# Patient Record
Sex: Female | Born: 1965 | Race: White | Hispanic: No | Marital: Married | Smoking: Former smoker
Health system: Southern US, Community
[De-identification: ages and names within clinical notes are randomized; demographics above are authoritative.]

## PROBLEM LIST (undated history)

## (undated) DIAGNOSIS — I10 Essential (primary) hypertension: Secondary | ICD-10-CM

## (undated) DIAGNOSIS — L80 Vitiligo: Secondary | ICD-10-CM

## (undated) HISTORY — DX: Essential (primary) hypertension: I10

## (undated) HISTORY — PX: BREAST SURGERY: SHX581

## (undated) HISTORY — DX: Vitiligo: L80

---

## 2001-06-03 HISTORY — PX: GASTRIC BYPASS: SHX52

## 2003-07-21 ENCOUNTER — Other Ambulatory Visit: Admission: RE | Admit: 2003-07-21 | Discharge: 2003-07-21 | Payer: Self-pay | Admitting: Obstetrics and Gynecology

## 2004-05-24 ENCOUNTER — Ambulatory Visit: Payer: Self-pay | Admitting: Family Medicine

## 2004-05-29 ENCOUNTER — Ambulatory Visit: Payer: Self-pay | Admitting: Family Medicine

## 2004-07-09 ENCOUNTER — Ambulatory Visit: Payer: Self-pay | Admitting: Family Medicine

## 2004-08-16 ENCOUNTER — Other Ambulatory Visit: Admission: RE | Admit: 2004-08-16 | Discharge: 2004-08-16 | Payer: Self-pay | Admitting: Obstetrics and Gynecology

## 2004-10-03 ENCOUNTER — Ambulatory Visit: Payer: Self-pay | Admitting: Family Medicine

## 2005-04-29 ENCOUNTER — Ambulatory Visit: Payer: Self-pay | Admitting: Family Medicine

## 2005-09-18 ENCOUNTER — Other Ambulatory Visit: Admission: RE | Admit: 2005-09-18 | Discharge: 2005-09-18 | Payer: Self-pay | Admitting: Obstetrics and Gynecology

## 2005-11-20 ENCOUNTER — Ambulatory Visit: Payer: Self-pay | Admitting: Family Medicine

## 2005-11-27 ENCOUNTER — Ambulatory Visit (HOSPITAL_COMMUNITY): Admission: RE | Admit: 2005-11-27 | Discharge: 2005-11-27 | Payer: Self-pay | Admitting: *Deleted

## 2007-07-16 IMAGING — CT CT PELVIS W/ CM
1 of 3 series · 14 of 32 positions shown, 19 images · IV contrast (omnipaque)
Comparison: None available.

CLINICAL DATA: 40-year-old with gastric bypass surgery done 0001.  Patient is having abdominal pain radiating to back for 12 days. 
ABDOMEN CT WITH CONTRAST:
TECHNIQUE: Multidetector CT imaging of the abdomen was performed following the standard protocol during bolus administration of intravenous contrast.
Contrast:  125 cc Omnipaque 300.
TECHNIQUE: Multidetector CT imaging of the pelvis was performed following the standard protocol during bolus administration of intravenous contrast.

[Series 2: abd_pel 5.0 b40f st · axial · 0.59mm/px · z∈[-572,-172]mm · 14 of 90 slices shown, 19 images]
[im 5/90  soft-tissue]
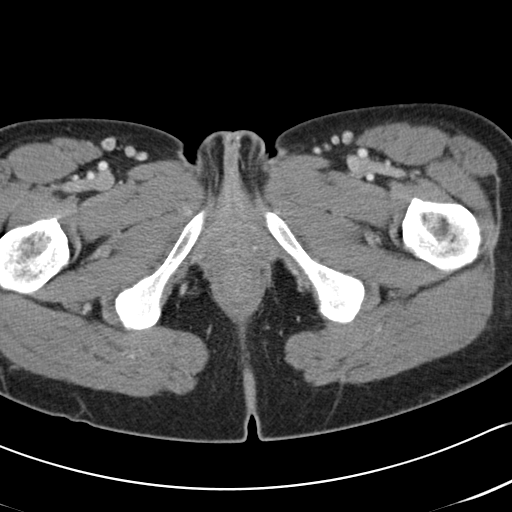
[im 5/90  bone]
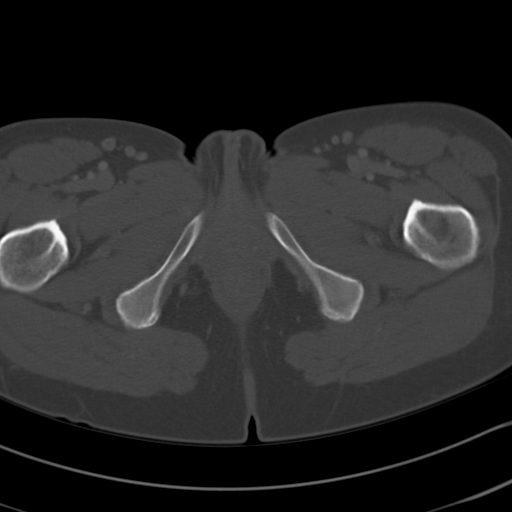
[im 15/90  soft-tissue]
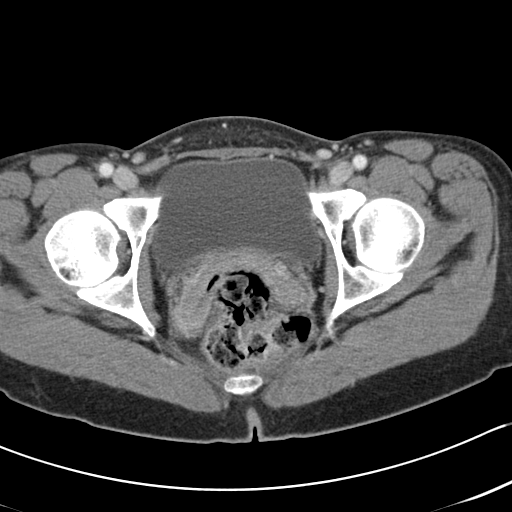
[im 19/90  soft-tissue]
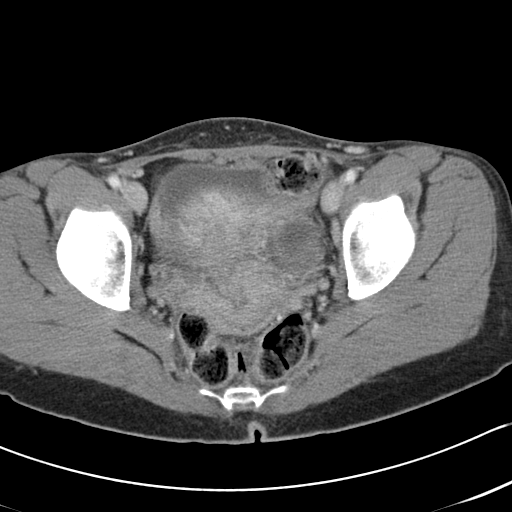
[im 24/90  soft-tissue]
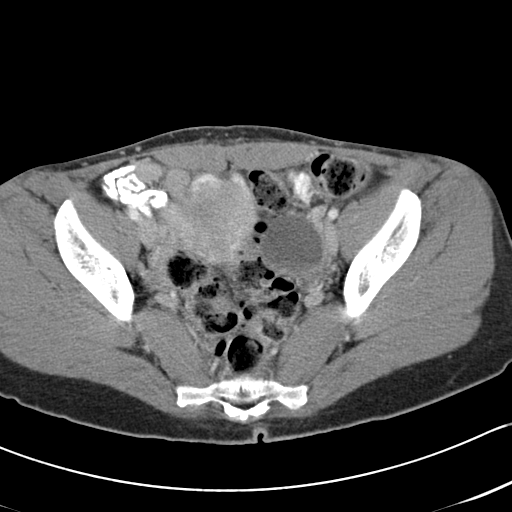
[im 33/90  soft-tissue]
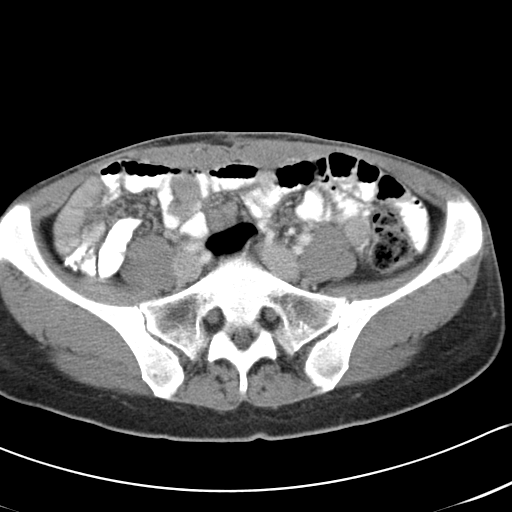
[im 38/90  soft-tissue]
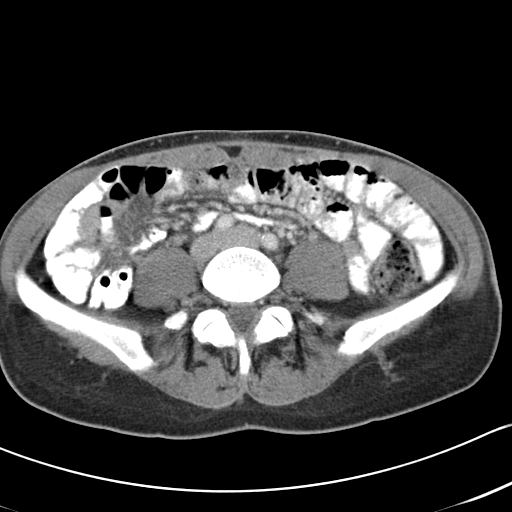
[im 47/90  soft-tissue]
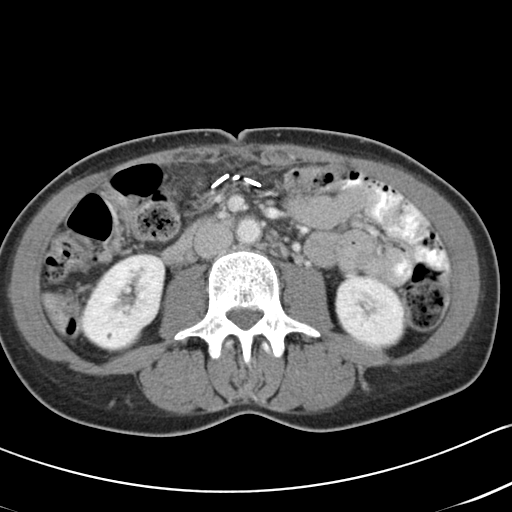
[im 52/90  soft-tissue]
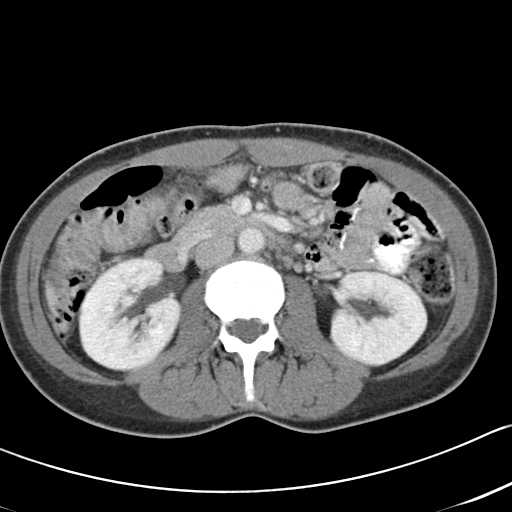
[im 57/90  soft-tissue]
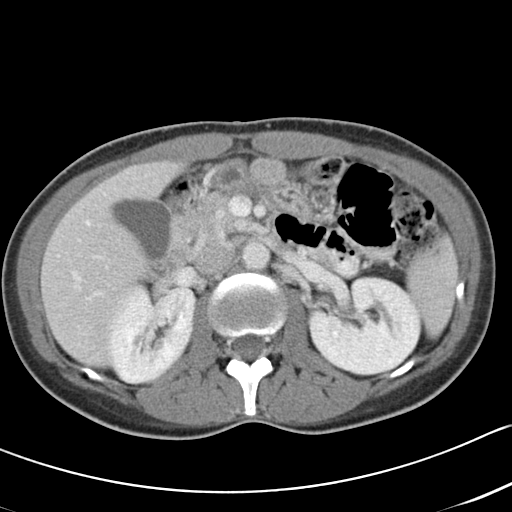
[im 57/90  bone]
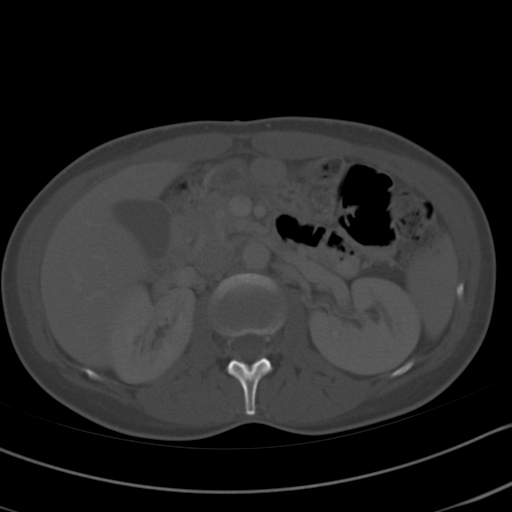
[im 66/90  soft-tissue]
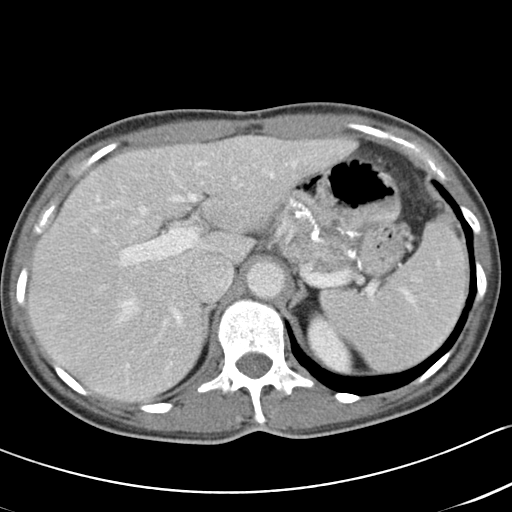
[im 71/90  soft-tissue]
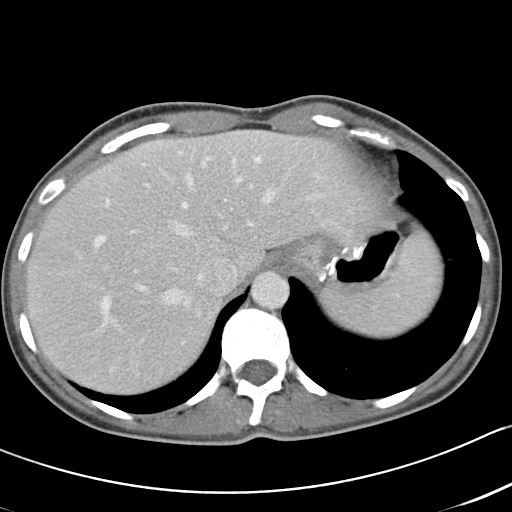
[im 71/90  lung]
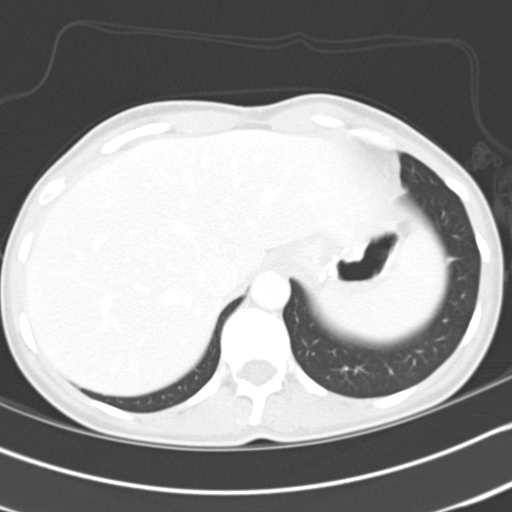
[im 75/90  soft-tissue]
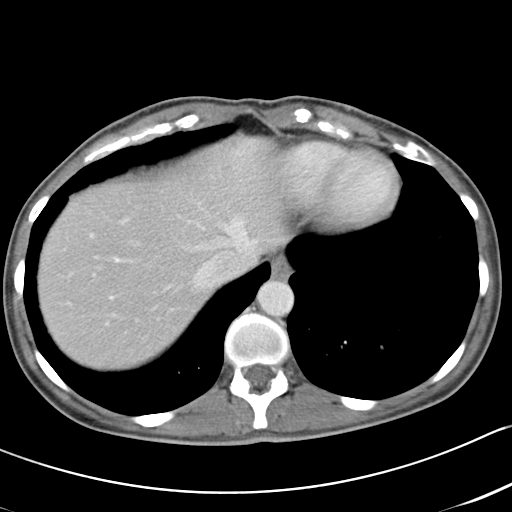
[im 75/90  lung]
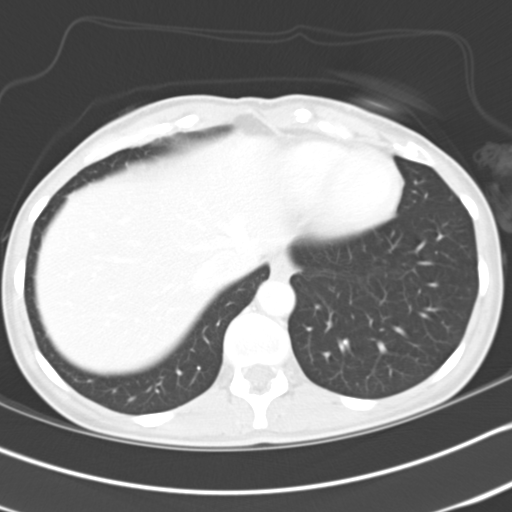
[im 80/90  lung]
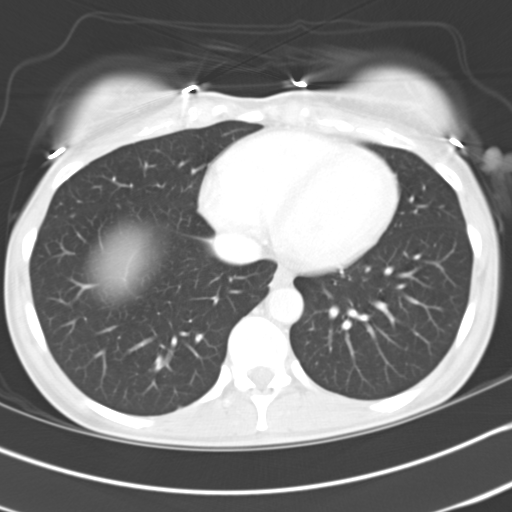
[im 85/90  soft-tissue]
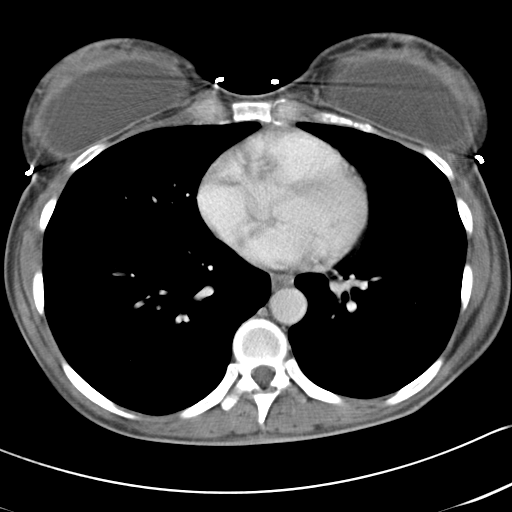
[im 85/90  lung]
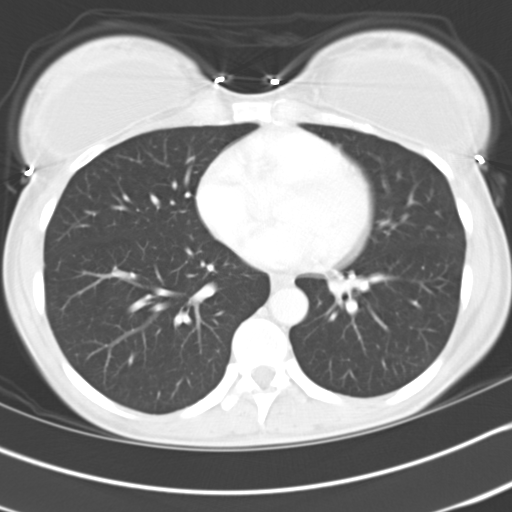

[14 of 32 positions shown; findings below may reference images not displayed]

FINDINGS: The lung bases are clear.  
The liver, spleen, pancreas, adrenal glands and kidneys are unremarkable.  There are few tiny renal cysts bilaterally.  No hydronephrosis.  The gallbladder appears normal.  There are surgical changes related to gastric bypass surgery.  I do not see any complicating features.  No findings to suggest an internal hernia or obstruction.  There is contrast all the way through to the colon without bowel dilatation or wall thickening.  The SMA and SMV have a normal relationship.  The aorta is normal in caliber.  The major branch vessels are normal.  The bony structures are intact.  No mesenteric or retroperitoneal masses or adenopathy.
IMPRESSION: 1.  Surgical change related to gastric bypass surgery.  I do not see any complicating features such as internal hernia or obstruction.
2.  Remainder of the abdomen is unremarkable. 
PELVIS CT WITH CONTRAST:
FINDINGS: There is a 3.8 X 3.8 cm cyst associated with the left ovary.  There is some layering higher attenuation debris, which likely reflects hemorrhage.  Endometrium is slightly prominent but it is probably due to secretary phase of ovulation.  The rectum, sigmoid colon and visualized small bowel loops are unremarkable.
IMPRESSION: Unremarkable CT pelvis except for a 3.8 cm slightly complex cyst associated with the left ovary.  I would recommend  sonographic follow-up to make sure this resolves.

## 2008-04-18 ENCOUNTER — Encounter: Payer: Self-pay | Admitting: Family Medicine

## 2009-04-03 LAB — HM PAP SMEAR

## 2009-04-19 ENCOUNTER — Ambulatory Visit: Payer: Self-pay | Admitting: Family Medicine

## 2009-04-26 ENCOUNTER — Telehealth: Payer: Self-pay | Admitting: Family Medicine

## 2009-05-02 ENCOUNTER — Encounter: Admission: RE | Admit: 2009-05-02 | Discharge: 2009-05-02 | Payer: Self-pay | Admitting: Obstetrics and Gynecology

## 2009-09-06 ENCOUNTER — Ambulatory Visit: Payer: Self-pay | Admitting: Family Medicine

## 2009-09-06 DIAGNOSIS — IMO0002 Reserved for concepts with insufficient information to code with codable children: Secondary | ICD-10-CM

## 2009-09-08 ENCOUNTER — Ambulatory Visit: Payer: Self-pay | Admitting: Family Medicine

## 2009-09-11 ENCOUNTER — Ambulatory Visit: Payer: Self-pay | Admitting: Family Medicine

## 2009-09-12 ENCOUNTER — Encounter: Payer: Self-pay | Admitting: Family Medicine

## 2009-09-14 ENCOUNTER — Ambulatory Visit: Payer: Self-pay | Admitting: Family Medicine

## 2009-12-19 ENCOUNTER — Telehealth: Payer: Self-pay | Admitting: Family Medicine

## 2009-12-21 ENCOUNTER — Telehealth: Payer: Self-pay | Admitting: Family Medicine

## 2010-02-09 ENCOUNTER — Ambulatory Visit: Payer: Self-pay | Admitting: Family Medicine

## 2010-02-09 DIAGNOSIS — F070 Personality change due to known physiological condition: Secondary | ICD-10-CM

## 2010-04-06 ENCOUNTER — Telehealth: Payer: Self-pay | Admitting: Family Medicine

## 2010-04-30 ENCOUNTER — Ambulatory Visit: Payer: Self-pay | Admitting: Family Medicine

## 2010-04-30 DIAGNOSIS — J069 Acute upper respiratory infection, unspecified: Secondary | ICD-10-CM | POA: Insufficient documentation

## 2010-05-03 ENCOUNTER — Telehealth: Payer: Self-pay | Admitting: Family Medicine

## 2010-07-03 NOTE — Assessment & Plan Note (Signed)
Summary: BOIL/DLO   Vital Signs:  Patient profile:   45 year old female Height:      62.25 inches Weight:      140.38 pounds BMI:     25.56 Temp:     98 degrees F Pulse rate:   80 / minute Resp:     16 per minute   History of Present Illness: 45 year old female:  R arm boil  Approx 2-3 days ago, post arm redness and pain developed with induration. Has been spreading. No trauma. No known bite. Wonders if it might be a "spider bite."  ROS: no fever, chill, sweats.  GEN: Well-developed,well-nourished,in no acute distress; alert,appropriate and cooperative throughout examination HEENT: Normocephalic and atraumatic without obvious abnormalities. No apparent alopecia or balding. Ears, externally no deformities PULM: Breathing comfortably in no respiratory distress EXT: No clubbing, cyanosis, or edema PSYCH: Normally interactive. Cooperative during the interview. Pleasant. Friendly and conversant. Not anxious or depressed appearing. Normal, full affect.   R arm: 3 cm across, raised slightly warm, indurated area with no fluctuance.  Allergies: 1)  ! Sulfa   Impression & Recommendations:  Problem # 1:  ABSCESS, ARM (ICD-682.3) Assessment New i think this is an abcess, R post arm  warm compresses 10 times a day  f/u friday to recheck and see if needs I and D --- with Dr. Dayton Martes  Her updated medication list for this problem includes:    Doxycycline Hyclate 100 Mg Caps (Doxycycline hyclate) .Marland Kitchen... Take 1 tab twice a day  Complete Medication List: 1)  Xanax 0.5 Mg Tabs (Alprazolam) .... Take 1 tab by mouth as needed for pms (uses only occasionally) 2)  Ambien 10 Mg Tabs (Zolpidem tartrate) .... Take 1 tab by mouth at bedtime as needed (very occasional) 3)  Multivitamins Tabs (Multiple vitamin) .... Take 1 tablet by mouth once a day 4)  Doxycycline Hyclate 100 Mg Caps (Doxycycline hyclate) .... Take 1 tab twice a day  Patient Instructions: 1)  f/u Friday with Dr.  Dayton Martes Prescriptions: DOXYCYCLINE HYCLATE 100 MG CAPS (DOXYCYCLINE HYCLATE) Take 1 tab twice a day  #20 x 0   Entered and Authorized by:   Hannah Beat MD   Signed by:   Hannah Beat MD on 09/06/2009   Method used:   [facility]        [id number]       [address]       [place], [place]  [address]       Ph: [phone]       Fax: [phone]   RxID:   [id number]

## 2010-07-03 NOTE — Progress Notes (Signed)
Summary: pt has ? thrush  Phone Note Call from Patient Call back at [facility] Phone [phone] Call back at [phone]   Caller: Patient Summary of Call: Pt states she has thrush in her mouth and she is asking if she can have nystatin called in.  She was seen by her gyn last week and treated for a vaginal yeast infection. She says she has had thrush before so she knows this is the same thing.   [facility] Initial call taken by: Lowella Petties CMA,  December 19, 2009 11:41 AM    New/Updated Medications: NYSTATIN 100000 UNIT/ML SUSP (NYSTATIN) 5 ml swish, hold and swollow qid Prescriptions: NYSTATIN 100000 UNIT/ML SUSP (NYSTATIN) 5 ml swish, hold and swollow qid  #50 ml x 0   Entered and Authorized by:   Ruthe Mannan MD   Signed by:   Ruthe Mannan MD on 12/19/2009   Method used:   [facility] to        [id number]       [address]       [place], [place]  [address]       Ph: [phone]       Fax: [phone]   RxID:   [id number]   Appended Document: pt has ? thrush Spoke with patient and notified of prescription.

## 2010-07-03 NOTE — Progress Notes (Signed)
Summary: ok to take excedrin?  Phone Note Call from Patient Call back at [phone]   Caller: Patient Call For: Ruthe Mannan MD Summary of Call: Pt has been having some severe headaches and she is asking if ok for her to take excedrin migraine.  She has a sulfa allergy and just wants to make sure that would be ok.  Leave message if she doesnt answer. Initial call taken by: Lowella Petties CMA, AAMA,  April 06, 2010 2:29 PM  Follow-up for Phone Call        It should be safe.   Ruthe Mannan MD  April 06, 2010 3:00 PM Advised pt.   Lowella Petties CMA, AAMA  April 06, 2010 3:16 PM

## 2010-07-03 NOTE — Assessment & Plan Note (Signed)
Summary: COUGH,ST,CONGESTION/CLE   Vital Signs:  Patient profile:   45 year old female Height:      62.25 inches Weight:      141 pounds BMI:     25.67 Temp:     98.6 degrees F oral Pulse rate:   84 / minute Pulse rhythm:   regular BP sitting:   130 / 80  (right arm) Cuff size:   regular  Vitals Entered By: Linde Gillis CMA Duncan Dull) (April 30, 2010 9:58 AM) CC: sore throat, stuffy nose, congestion   History of Present Illness: 45 yo here to discuss two issues.  1.  URI- last few days, very congested.  Had body aches yesterday, subjective fever.  Dry cough, cough suppressant works.  Mild sore throat.  No wheezing or SOB.  No CP but feels "tight" today.  2.  Attention issues- was on Adderall but does not want to be on it long term.  Asks if there is anything else she can try.  Has had issues with depression since husband left.  no anxiety, SI or HI.  They are in counselling together, trying to work things out.  Current Medications (verified): 1)  Adderall 5 Mg  Tabs (Amphetamine-Dextroamphetamine) .Marland Kitchen.. 1 Tab By Mouth Every Morning. 2)  Wellbutrin Sr 150 Mg Xr12h-Tab (Bupropion Hcl) .Marland Kitchen.. 1 Tab By Mouth Every  Morning.  Allergies: 1)  ! Sulfa  Past History:  Past Medical History: Last updated: 04/19/2009 previous h/o HTN, HLD, asthma prior to gastric bypass in 2003  Past Surgical History: Last updated: 04/19/2009 Gastric bypass 2003  Family History: Last updated: 04/19/2009 Adopted, does no know family history  Social History: Last updated: 04/19/2009 Lives with husband and three children, ages 16, 79, and 86 in [place].   Never Smoked Alcohol use-no Drug use-no Regular exercise-yes  Risk Factors: Exercise: yes (04/19/2009)  Risk Factors: Smoking Status: never (04/19/2009)  Review of Systems      See HPI General:  Complains of chills and fever. ENT:  Complains of postnasal drainage, sinus pressure, and sore throat. CV:  Denies chest pain or  discomfort. Resp:  Complains of cough; denies shortness of breath, sputum productive, and wheezing. GI:  Denies abdominal pain, nausea, and vomiting.  Physical Exam  General:  Well-developed,well-nourished,in no acute distress; alert,appropriate and cooperative throughout examination Ears:  R ear normal and L ear normal.   Nose:  mild erythema Mouth:  MMM, pharyngeal erythema.   Lungs:  normal respiratory effort.   Heart:  Normal rate and regular rhythm. S1 and S2 normal without gallop, murmur, click, rub or other extra sounds. Extremities:  No clubbing, cyanosis, edema, or deformity noted with normal full range of motion of all joints.   Psych:  normally interactive, good eye contact, not anxious appearing, and not depressed appearing.     Impression & Recommendations:  Problem # 1:  POOR CONCENTRATION (ICD-310.1) Assessment Unchanged Will try Wellbutrin 150 mg daily. Follow up in one month.  Problem # 2:  URI (ICD-465.9) Assessment: New Likely viral, continue supportive care as per pt instructions.  Complete Medication List: 1)  Adderall 5 Mg Tabs (Amphetamine-dextroamphetamine) .Marland Kitchen.. 1 tab by mouth every morning. 2)  Wellbutrin Sr 150 Mg Xr12h-tab (Bupropion hcl) .Marland Kitchen.. 1 tab by mouth every  morning.  Patient Instructions: 1)  Get plenty of rest, drink lots of clear liquids, and use Tylenol or Ibuprofen for fever and comfort. Return in 7-10 days if you're not better: sooner if you'er feeling worse.  2)  Follow up in one month. Prescriptions: WELLBUTRIN SR 150 MG XR12H-TAB (BUPROPION HCL) 1 tab by mouth every  morning.  #30 x 0   Entered and Authorized by:   Ruthe Mannan MD   Signed by:   Ruthe Mannan MD on 04/30/2010   Method used:   [facility] to        [id number]       [address]       [place], [place]  [address]       Ph: [phone]       Fax: [phone]   RxID:   [id number]    Orders Added: 1)  Est. Patient Level IV  [06301]    Current Allergies (reviewed today): ! SULFA

## 2010-07-03 NOTE — Miscellaneous (Signed)
Summary: Consent for I & D Right Upper Arm  Consent for I & D Right Upper Arm   Imported By: Beau Fanny 09/11/2009 14:48:15  _____________________________________________________________________  External Attachment:    Type:   Image     Comment:   External Document

## 2010-07-03 NOTE — Assessment & Plan Note (Signed)
Summary: FOLLOW UP / LFW   Vital Signs:  Patient profile:   45 year old female Height:      62.25 inches Weight:      136.4 pounds BMI:     24.84 Temp:     98.3 degrees F oral Pulse rate:   80 / minute Pulse rhythm:   regular BP sitting:   120 / 80  (left arm) Cuff size:   regular  Vitals Entered By: Benny Lennert CMA Duncan Dull) (September 14, 2009 2:09 PM)  History of Present Illness: Chief complaint follow up boil  Allergies: 1)  ! Sulfa   Impression & Recommendations:  Problem # 1:  ABSCESS, ARM (ICD-682.3)  f/u s/p I and D  wound looks good clean, good granulation tissue much less indurated  universal procedure window  The following medications were removed from the medication list:    Keflex 250 Mg Caps (Cephalexin) .Marland Kitchen... 1 tab every 6 hours x 10 days Her updated medication list for this problem includes:    Doxycycline Hyclate 100 Mg Caps (Doxycycline hyclate) .Marland Kitchen... Take 1 tab twice a day  Orders: No Charge Patient Arrived (NCPA0) (NCPA0)  Complete Medication List: 1)  Xanax 0.5 Mg Tabs (Alprazolam) .... Take 1 tab by mouth as needed for pms (uses only occasionally) 2)  Ambien 10 Mg Tabs (Zolpidem tartrate) .... Take 1 tab by mouth at bedtime as needed (very occasional) 3)  Multivitamins Tabs (Multiple vitamin) .... Take 1 tablet by mouth once a day 4)  Doxycycline Hyclate 100 Mg Caps (Doxycycline hyclate) .... Take 1 tab twice a day  Current Allergies (reviewed today): ! SULFA

## 2010-07-03 NOTE — Assessment & Plan Note (Signed)
Summary: TALK TO U ABOUT SOME THINGS/DLO   Vital Signs:  Patient profile:   45 year old female Height:      62.25 inches Weight:      148 pounds BMI:     26.95 Temp:     98.7 degrees F oral Pulse rate:   80 / minute Pulse rhythm:   regular BP sitting:   102 / 70  (right arm) Cuff size:   regular  Vitals Entered By: Linde Gillis CMA Duncan Dull) (February 09, 2010 12:17 PM) CC: discuss personal issues   History of Present Illness: 45 yo here to discuss difficulty concentrating.  Started several months ago but states that she has always had some issues with this throughout her life.  No formal diagnosis of ADHD. Has taken Wellbutrin in past, did not like how it made her feel. Last several weeks, cannot concentrate at work, forgets what she was supposed to be doing for her boss.  Was under a lot of stress over the past several months but those stressors are improving. Denies any symptoms of anxiety, depression. She is sleeping ok.  Appetite is good.  Current Medications (verified): 1)  Adderall 5 Mg  Tabs (Amphetamine-Dextroamphetamine) .Marland Kitchen.. 1 Tab By Mouth Every Morning.  Allergies: 1)  ! Sulfa  Past History:  Past Medical History: Last updated: 04/19/2009 previous h/o HTN, HLD, asthma prior to gastric bypass in 2003  Past Surgical History: Last updated: 04/19/2009 Gastric bypass 2003  Family History: Last updated: 04/19/2009 Adopted, does no know family history  Social History: Last updated: 04/19/2009 Lives with husband and three children, ages 51, 35, and 66 in [place].   Never Smoked Alcohol use-no Drug use-no Regular exercise-yes  Risk Factors: Exercise: yes (04/19/2009)  Risk Factors: Smoking Status: never (04/19/2009)  Review of Systems      See HPI Psych:  Denies alternate hallucination ( auditory/visual), anxiety, depression, easily angered, easily tearful, irritability, mental problems, panic attacks, sense of great danger, suicidal  thoughts/plans, thoughts of violence, unusual visions or sounds, and thoughts /plans of harming others.  Physical Exam  General:  Well-developed,well-nourished,in no acute distress; alert,appropriate and cooperative throughout examination Psych:  normally interactive, good eye contact, not anxious appearing, and not depressed appearing.     Impression & Recommendations:  Problem # 1:  POOR CONCENTRATION (ICD-310.1) Assessment New Has not had a formal ADD assessment but per her description, she has had these symptoms since childhood. Will try low dose Adderall. Pt to follow up with me in one month. Time spent with patient 25 minutes, more than 50% of this time was spent counseling patient on ADHD.  Complete Medication List: 1)  Adderall 5 Mg Tabs (Amphetamine-dextroamphetamine) .Marland Kitchen.. 1 tab by mouth every morning. Prescriptions: ADDERALL 5 MG  TABS (AMPHETAMINE-DEXTROAMPHETAMINE) 1 tab by mouth every morning.  #30 x 0   Entered and Authorized by:   Ruthe Mannan MD   Signed by:   Ruthe Mannan MD on 02/09/2010   Method used:   Print then Give to Patient   RxID:   [id number]   Prior Medications (reviewed today): None Current Allergies (reviewed today): ! SULFA

## 2010-07-03 NOTE — Assessment & Plan Note (Signed)
Summary: LANCE BOIL PER ARON/DLO   Vital Signs:  Patient profile:   45 year old female Height:      62.25 inches Weight:      138.2 pounds BMI:     25.17 Temp:     98.6 degrees F oral Pulse rate:   84 / minute Pulse rhythm:   regular BP sitting:   108 / 60  (left arm) Cuff size:   regular  Vitals Entered By: Benny Lennert CMA Duncan Dull) (September 11, 2009 10:04 AM)  History of Present Illness: Chief complaint lance boil  R posterior arm  cellulitis abcess: she has had some improvement per her report, but on my examination, the area of induration is larger. Continues with redness and pain on Doxy and Keflex.  Has had a small amount of drainage after doing some moist compresses.  No fevers.  I and D 3 cc purulent, loculated   ROS: recently in hospital with husbands hosp, no fever, chills, sweats, sob.    Allergies: 1)  ! Sulfa  Past History:  Past medical, surgical, family and social histories (including risk factors) reviewed, and no changes noted (except as noted below).  Past Medical History: Reviewed history from 04/19/2009 and no changes required. previous h/o HTN, HLD, asthma prior to gastric bypass in 2003  Past Surgical History: Reviewed history from 04/19/2009 and no changes required. Gastric bypass 2003  Family History: Reviewed history from 04/19/2009 and no changes required. Adopted, does no know family history  Social History: Reviewed history from 04/19/2009 and no changes required. Lives with husband and three children, ages 28, 21, and 85 in [place].   Never Smoked Alcohol use-no Drug use-no Regular exercise-yes  Physical Exam  General:  Well-developed,well-nourished,in no acute distress; alert,appropriate and cooperative throughout examination Head:  Normocephalic and atraumatic without obvious abnormalities. No apparent alopecia or balding. Ears:  no external deformities.   Nose:  no external deformity.   Lungs:  normal respiratory  effort.   Skin:  4 cm across, raised slightly warm, indurated area extends beyond this. There is one area of central fluctuance.    Impression & Recommendations:  Problem # 1:  ABSCESS, ARM (ICD-682.3) Assessment Deteriorated Cont with abx  I and D send for culture and sensitivity  Her updated medication list for this problem includes:    Doxycycline Hyclate 100 Mg Caps (Doxycycline hyclate) .Marland Kitchen... Take 1 tab twice a day    Keflex 250 Mg Caps (Cephalexin) .Marland Kitchen... 1 tab every 6 hours x 10 days  Orders: T-Culture, Wound ([id number]) I&D Abscess, Complex (10061)  Complete Medication List: 1)  Xanax 0.5 Mg Tabs (Alprazolam) .... Take 1 tab by mouth as needed for pms (uses only occasionally) 2)  Ambien 10 Mg Tabs (Zolpidem tartrate) .... Take 1 tab by mouth at bedtime as needed (very occasional) 3)  Multivitamins Tabs (Multiple vitamin) .... Take 1 tablet by mouth once a day 4)  Doxycycline Hyclate 100 Mg Caps (Doxycycline hyclate) .... Take 1 tab twice a day 5)  Keflex 250 Mg Caps (Cephalexin) .Marland Kitchen.. 1 tab every 6 hours x 10 days  Patient Instructions: 1)  f/u Thursday with me  Current Allergies (reviewed today): ! SULFA   PROCEDURE:   I & D Site: R posterior arm Size: 4 cm area Anesthesia: 8 cc of 1/2 and 1/2, 4 mL Lidocaine 2% and 4 mL Marcaine 0.5% Procedure: Patient was prepped with alcohol then anesthetized without difficulty. 18 gauge needle was used to aspirate and minimal pus came  out. Then #11 blade used to open area, and signifant amounts of congealed, loculated material expressed. Approx 3 mL of total materal expressed after several rounds of breaking up loculations. Sterile wick placed and wound dressed. Cultures sent. Disposition: Return to clinic as Instructed by MD  Appended Document: LANCE BOIL PER ARON/DLO

## 2010-07-03 NOTE — Progress Notes (Signed)
Summary: feeling worse   Phone Note Call from Patient Call back at [phone]   Caller: Patient Call For: Dr. Milinda Antis  Summary of Call: Patient saw Dr. Dayton Martes on Monday with URI symptoms. Patient is calling back today stating that she feel worse than she did, feels like her throat is on fire, coughing up green phlegm. She is asking if she could have antibiotic called in to [facility] [address]  Initial call taken by: Melody Comas,  May 03, 2010 1:26 PM  Follow-up for Phone Call        will tx with zpak and f/u if not improved px written on EMR for call in  Follow-up by: Judith Part MD,  May 03, 2010 1:56 PM  Additional Follow-up for Phone Call Additional follow up Details #1::        [facility] as instructed. Left message for patient to call back. Lewanda Rife LPN  May 03, 2010 2:39 PM   Advised pt. Additional Follow-up by: Lowella Petties CMA, AAMA,  May 03, 2010 2:59 PM    New/Updated Medications: ZITHROMAX Z-PAK 250 MG TABS (AZITHROMYCIN) take by mouth as directed Prescriptions: ZITHROMAX Z-PAK 250 MG TABS (AZITHROMYCIN) take by mouth as directed  #1 pack x 0   Entered and Authorized by:   Judith Part MD   Signed by:   Lewanda Rife LPN on 45/40/9811   Method used:   [facility] ...       [facility]  [address] [id number]       [address]       [place], [place]  [address]       Ph: [phone]       Fax: [phone]   RxID:   [id number]

## 2010-07-03 NOTE — Progress Notes (Signed)
Summary: requests refill on nystatin  Phone Note Call from Patient Call back at Home Phone [phone] Call back at [phone]   Caller: Patient Summary of Call: Pt was given nystatin for thrush, she says this is working well but she doesnt have enough to last. She will run out of this today and needs to continue for a few more days. Requesting refill.  [facility] Initial call taken by: Lowella Petties CMA,  December 21, 2009 12:13 PM    Prescriptions: NYSTATIN 100000 UNIT/ML SUSP (NYSTATIN) 5 ml swish, hold and swollow qid  #50 ml x 0   Entered and Authorized by:   Ruthe Mannan MD   Signed by:   Ruthe Mannan MD on 12/21/2009   Method used:   [facility]ically to        [id number]       [address]       [place], [place]  [address]       Ph: [phone]       Fax: [phone]   RxID:   [id number]

## 2010-07-03 NOTE — Assessment & Plan Note (Signed)
Summary: F/U PER [facility]   Vital Signs:  Patient profile:   45 year old female Height:      62.25 inches Weight:      138 pounds BMI:     25.13 Temp:     98.5 degrees F oral Pulse rate:   84 / minute Pulse rhythm:   regular BP sitting:   102 / 62  (left arm) Cuff size:   regular  Vitals Entered By: Delilah Shan CMA Duncan Dull) (September 08, 2009 9:47 AM) CC: F/U per Dr. Patsy Lager   History of Present Illness: 45 year old female here for follow up right arm abscess.  Seen two days ago by Dr. Patsy Lager for 3 cm red, painful righ arm abscess.  Non fluctuant at that time, unable to I and D.  Placed on Doxy (has sulfa allergy) and advised to follow up today.  Since last seen, redness is better, but boil is exactly the same.  She is trying warm compresses 5 times per day. No fevers, chills, nausea, vomiting or diarrhea.    Current Medications (verified): 1)  Xanax 0.5 Mg Tabs (Alprazolam) .... Take 1 Tab By Mouth As Needed For Pms (Uses Only Occasionally) 2)  Ambien 10 Mg Tabs (Zolpidem Tartrate) .... Take 1 Tab By Mouth At Bedtime As Needed (Very Occasional) 3)  Multivitamins   Tabs (Multiple Vitamin) .... Take 1 Tablet By Mouth Once A Day 4)  Doxycycline Hyclate 100 Mg Caps (Doxycycline Hyclate) .... Take 1 Tab Twice A Day 5)  Keflex 250 Mg Caps (Cephalexin) .Marland Kitchen.. 1 Tab Every 6 Hours X 10 Days  Allergies: 1)  ! Sulfa  Review of Systems      See HPI General:  Denies chills, fatigue, fever, and malaise. GI:  Denies diarrhea, nausea, and vomiting.  Physical Exam  General:  Well-developed,well-nourished,in no acute distress; alert,appropriate and cooperative throughout examination Mouth:  MMM Skin:  3 cm across, raised slightly warm, indurated area with no fluctuance. Psych:  normally interactive, good eye contact, not anxious appearing, and not depressed appearing.     Impression & Recommendations:  Problem # 1:  ABSCESS, ARM (ICD-682.3) Assessment Unchanged Non fluctuant,  explained that I and D at this point will not be very beneficial. Not worsening but not improving much either. Will give prescription for Keflex as well.  I want her to fill it to take with doxy if she spikes temp over weekend or redness begins to spread. See pt instructions for details. She will follow up with me or Dr. Patsy Lager on Monday. Her updated medication list for this problem includes:    Doxycycline Hyclate 100 Mg Caps (Doxycycline hyclate) .Marland Kitchen... Take 1 tab twice a day    Keflex 250 Mg Caps (Cephalexin) .Marland Kitchen... 1 tab every 6 hours x 10 days  Complete Medication List: 1)  Xanax 0.5 Mg Tabs (Alprazolam) .... Take 1 tab by mouth as needed for pms (uses only occasionally) 2)  Ambien 10 Mg Tabs (Zolpidem tartrate) .... Take 1 tab by mouth at bedtime as needed (very occasional) 3)  Multivitamins Tabs (Multiple vitamin) .... Take 1 tablet by mouth once a day 4)  Doxycycline Hyclate 100 Mg Caps (Doxycycline hyclate) .... Take 1 tab twice a day 5)  Keflex 250 Mg Caps (Cephalexin) .Marland Kitchen.. 1 tab every 6 hours x 10 days  Patient Instructions: 1)  Good to see you. 2)  Come me or Dr. Patsy Lager on Monday to follow up. 3)  Start the Keflex if you feel  redness or size has worsened or feel feverish. 4)  If you feel really sick over weekend, call doctor on call. Prescriptions: KEFLEX 250 MG CAPS (CEPHALEXIN) 1 tab every 6 hours x 10 days  #40 x 0   Entered and Authorized by:   Ruthe Mannan MD   Signed by:   Ruthe Mannan MD on 09/08/2009   Method used:   Print then Give to Patient   RxID:   [id number]   Current Allergies (reviewed today): ! SULFA

## 2010-09-14 ENCOUNTER — Encounter: Payer: Self-pay | Admitting: Family Medicine

## 2010-09-20 ENCOUNTER — Ambulatory Visit (INDEPENDENT_AMBULATORY_CARE_PROVIDER_SITE_OTHER): Payer: BLUE CROSS/BLUE SHIELD | Admitting: Family Medicine

## 2010-09-20 ENCOUNTER — Encounter: Payer: Self-pay | Admitting: Family Medicine

## 2010-09-20 DIAGNOSIS — F988 Other specified behavioral and emotional disorders with onset usually occurring in childhood and adolescence: Secondary | ICD-10-CM | POA: Insufficient documentation

## 2010-09-20 MED ORDER — AMPHETAMINE-DEXTROAMPHET ER 20 MG PO CP24: 20.0000 mg | ORAL_CAPSULE | ORAL | Status: AC

## 2010-09-20 NOTE — Progress Notes (Signed)
  Subjective:    Patient ID: Dominique Michael, female    DOB: 18-May-1966, 45 y.o.   MRN: [id number]  HPI  45 yo WF presents for NOV.  She is previously healthy, premenopausal , G3P3.  She sees Dr Vincente Poli annually for her pap, mammo and labs.  She denies any hx of HTN, heart problems, anxiety, depression, palpitations or insomnia.  She is currently not taking any meds.  She checks her BP at home and runs <140/90.    She is here today c/o a resurgence of her ADD.  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  The past year, she has started online college classes and is working part time.  She is juggling this with her 3 kids and she is having a hard time staying focused, completing tasks and is getting overwhelmed.    BP 156/91  Pulse 80  Ht 5\' 2"  (1.575 m)  Wt 151 lb (68.493 kg)  BMI 27.62 kg/m2  SpO2 100%  LMP 08/22/2010   Review of Systems  Constitutional: Negative for fever, chills, appetite change, fatigue and unexpected weight change.  HENT: Negative for congestion, sore throat and rhinorrhea.   Respiratory: Negative for cough, shortness of breath and wheezing.   Cardiovascular: Negative for chest pain, palpitations and leg swelling.  Gastrointestinal: Negative for nausea, diarrhea, constipation and blood in stool.  Genitourinary: Negative for urgency, frequency and difficulty urinating.  Musculoskeletal: Negative for myalgias and arthralgias.  Neurological: Positive for headaches. Negative for tremors.  Hematological: Does not bruise/bleed easily.  Psychiatric/Behavioral: Positive for decreased concentration. Negative for suicidal ideas, sleep disturbance, dysphoric mood and agitation. The patient is not nervous/anxious and is not hyperactive.        Objective:   Physical Exam  Constitutional: She appears well-developed and well-nourished.  HENT:  Head: Normocephalic and atraumatic.  Eyes: Conjunctivae are  normal. No scleral icterus.  Neck: No thyromegaly present.  Cardiovascular: Normal rate, regular rhythm and normal heart sounds.   No murmur heard. Pulmonary/Chest: Effort normal and breath sounds normal. No respiratory distress. She has no wheezes.  Musculoskeletal: She exhibits no edema.  Lymphadenopathy:    She has no cervical adenopathy.  Neurological:       No tremor  Skin: Skin is warm and dry.       vitilgo on hands, elbows  Psychiatric: She has a normal mood and affect.          Assessment & Plan:

## 2010-09-20 NOTE — Patient Instructions (Signed)
Call me with home BP reading next wk.  Start Adderall XR 20 mg in the early morning.  Call if any problems.  Return for office visit in 3 mos.  Call for new RX to pick up each month.

## 2010-09-20 NOTE — Assessment & Plan Note (Signed)
ADD diagnosed years ago and did well on Adderall.  She is currently overwhelmed by juggling school, her 3 kids and work and screens neg for depression and anxiety and has many inattentive symptoms.  She has white coat HTN and her BP is high today.  She will check her BP at home and call me with results.  Will start her on Adderall XR 20 mg qAM.  RTC for f/u in 3 mos.

## 2010-10-03 ENCOUNTER — Telehealth: Payer: Self-pay | Admitting: Family Medicine

## 2010-10-03 NOTE — Telephone Encounter (Signed)
These look great.  OK to stay on Adderall for ADD.

## 2010-10-03 NOTE — Telephone Encounter (Signed)
Pt called as FYI to give BP readings to Dr. Cathey Endow for review. The BP readings are: Last week---127/83 This week----123/82 Please advice . Plan:  Routed to provider Jarvis Newcomer, LPN Domingo Dimes

## 2010-10-04 NOTE — Telephone Encounter (Signed)
Pt notified once response received from DR. Bowen.  Pt told okay to continue Aderrall for ADD since her B/Ps look good.  Pt voiced understanding. Jarvis Newcomer, LPN Domingo Dimes

## 2010-10-26 ENCOUNTER — Telehealth: Payer: Self-pay | Admitting: Family Medicine

## 2010-10-26 NOTE — Telephone Encounter (Signed)
Pt said her insurance [employer] wants pt to change to plain adderall but suggested she do a 90 day supply and told her they could do that even though it is controlled .  Adderall extend release is too high of copay and pt was told from [employer] that the adderall extend release would double every month $130.Marland Kitchen  Last month and this month it will double to $260.00.  Pt doesn't have that kind of money.  If changed, pt said [employer] would give Adderall plain 25 mg and not 20mg  for 90 day supply and it would be $30.00.a month which would = to $90.00.  Plan:    Please advise and routed to Dr. Arlice Colt, LPN Domingo Dimes

## 2010-10-26 NOTE — Telephone Encounter (Signed)
Pt notified that Dr. Cathey Endow researched pt record and she is regular pt of Dr. Dayton Martes at [facility] and her suggestion is to get Dr. Dayton Martes to refill the adderall and see him for her regular PCP care.  Had to [facility] with clear instructions. Jarvis Newcomer, LPN Domingo Dimes

## 2010-10-26 NOTE — Telephone Encounter (Signed)
I researched patient's record and she is a regular patient of Dr Dayton Martes at [facility].  I am no longer going to see this patient or fill her meds.  My advice is for her to see her regular doctor for care including ADD.

## 2010-11-17 ENCOUNTER — Emergency Department: Payer: Self-pay | Admitting: Emergency Medicine

## 2011-11-06 ENCOUNTER — Telehealth: Payer: Self-pay | Admitting: Family Medicine

## 2011-11-06 NOTE — Telephone Encounter (Signed)
Caller: Cammy/Patient; PCP: Ruthe Mannan (Nestor Ramp); CB#: [phone];  Call regarding Thrush; Tongue has red patches. Pt advises she had thrush with the same sx. Her rx is expired. Would like a refill. Advised she will need an appt. Appt declined at this time. Pt dc'd the call before full triage.

## 2011-11-08 ENCOUNTER — Ambulatory Visit (INDEPENDENT_AMBULATORY_CARE_PROVIDER_SITE_OTHER): Payer: Managed Care, Other (non HMO) | Admitting: Family Medicine

## 2011-11-08 ENCOUNTER — Encounter: Payer: Self-pay | Admitting: Family Medicine

## 2011-11-08 VITALS — BP 110/70 | HR 72 | Temp 98.3°F | Ht 62.0 in | Wt 155.0 lb

## 2011-11-08 DIAGNOSIS — B370 Candidal stomatitis: Secondary | ICD-10-CM | POA: Insufficient documentation

## 2011-11-08 DIAGNOSIS — H6120 Impacted cerumen, unspecified ear: Secondary | ICD-10-CM

## 2011-11-08 MED ORDER — NYSTATIN 100000 UNIT/ML MT SUSP: 500000.0000 [IU] | Freq: Four times a day (QID) | OROMUCOSAL | Status: DC

## 2011-11-08 NOTE — Progress Notes (Signed)
46 yo here for:   Right ear pain- feels stopped up, not hearing as well out of her right ear. Tried OTC debrox, does not think she got any wax out. No drainage. No URI symptoms.  Her tongue has been sore for past couple of days.  Feels like when she had thrush in past.  Has had recent abx use and is under stress.   Patient Active Problem List  Diagnoses  . Attention deficit disorder   Past Medical History  Diagnosis Date  . Hypertension   . Asthma     prior to gastric bypass in 2003  . Vitiligo    Past Surgical History  Procedure Date  . Gastric bypass 2003  . Cesarean section   . Breast surgery     breast lift   History  Substance Use Topics  . Smoking status: Never Smoker   . Smokeless tobacco: Not on file  . Alcohol Use: No   Family History  Problem Relation Age of Onset  . Adopted: Yes   Allergies  Allergen Reactions  . Sulfonamide Derivatives     REACTION: Severe reaction.  Avoids foods with Sulfa content.   No current outpatient prescriptions on file prior to visit.   The PMH, PSH, Social History, Family History, Medications, and allergies have been reviewed in [facility], and have been updated if relevant.  ROS: See HPI  No cough, no runny nose. No sore throat. No wheezing.   Physical exam: BP 110/70  Pulse 72  Temp(Src) 98.3 F (36.8 C) (Oral)  Ht 5\' 2"  (1.575 m)  Wt 155 lb (70.308 kg)  BMI 28.35 kg/m2  General:  Well-developed,well-nourished,in no acute distress; alert,appropriate and cooperative throughout examination Head:  normocephalic and atraumatic.   Eyes:  vision grossly intact, pupils equal, pupils round, and pupils reactive to light.   Ears:   Ceruminosis is noted bilaterally, right >left.   After irrigation, TMs clear bilaterally.  Nose:  no external deformity.   Mouth:   White plaques on tongue and buccal mucosa Neck:  No deformities, masses, or tenderness noted. Breasts:  No mass, nodules, thickening, tenderness, bulging,  retraction, inflamation, nipple discharge or skin changes noted.   Lungs:  Normal respiratory effort, chest expands symmetrically. Lungs are clear to auscultation, no crackles or wheezes. Heart:  Normal rate and regular rhythm. S1 and S2 normal without gallop, murmur, click, rub or other extra sounds. Msk:  No deformity or scoliosis noted of thoracic or lumbar spine.   Extremities:  No clubbing, cyanosis, edema, or deformity noted with normal full range of motion of all joints.   Neurologic:  alert & oriented X3 and gait normal.   Skin:  Intact without suspicious lesions or rashes Psych:  Cognition and judgment appear intact. Alert and cooperative with normal attention span and concentration. No apparent delusions, illusions, hallucinations   Assessment and Plan:   1. Cerumen impaction   Wax is removed by syringing and manual debridement. Instructions for home care to prevent wax buildup are given.   2. Thrush, oral  Nystatin oral rinse- see pt instructions for details.

## 2011-11-11 ENCOUNTER — Telehealth: Payer: Self-pay | Admitting: Family Medicine

## 2011-11-11 MED ORDER — NYSTATIN 100000 UNIT/ML MT SUSP: 500000.0000 [IU] | Freq: Four times a day (QID) | OROMUCOSAL | Status: AC

## 2011-11-11 NOTE — Telephone Encounter (Signed)
  See HX from 11/06/11. Caller VERY impatient with info.  Pt saw Dr. Dayton Martes 11/08/11 and given Nystatin 60 ml to use 5 ml QID, so she used it thru the weekend and is out and still has a red tounge and Sx of thrush.  She states "I just don't think 2 days of Nystatin will clear this".  Toungue is red and inflamed but not white. Pt states "my tongue looks exactly like it did when she saw it Friday. Triage declined I'm going to [place] and I don't want to walk around with a red tounge."   [facility] AND CALL PT WHEN rx HAS BEEN CALLED IN.

## 2011-11-11 NOTE — Telephone Encounter (Signed)
Rx sent. I apologize-not sure why such a small amount was sent in --my error.

## 2011-11-11 NOTE — Telephone Encounter (Signed)
Advised pt

## 2011-11-12 ENCOUNTER — Telehealth: Payer: Self-pay | Admitting: Family Medicine

## 2011-11-12 NOTE — Telephone Encounter (Signed)
Triage Record Num: [id number] Operator: Caswell Corwin Patient Name: [facility] Call Date & Time: 11/11/2011 11:40:29AM Patient Phone: [phone] PCP: Ruthe Mannan Patient Gender: Female PCP Fax : [phone] Patient DOB: 1965-10-24 Practice Name: [facility] Reason for Call: See HX from 11/06/11. Caller VERY impatient with info. Pt saw Dr. Dayton Martes 11/08/11 and given Nystatin 60 ml to use 5 ml QID, so she used it thru the weekend and is out and still has a red tounge and Sx of thrush. She states "I just don't think 2 days of Nystatin will clear this". Toungue is red and inflamed but not white. Pt states "my tongue looks exactly like it did when she saw it Friday. Triage declined I'm going to [place] and I don't want to walk around with a red tounge." [facility] AND CALL PT WHEN rx HAS BEEN CALLED IN. Protocol(s) Used: Mouth Lesions Recommended Outcome per Protocol: See Provider within 24 hours Reason for Outcome: Painful pale yellow or white spot(s) on the lining of the mouth, gums, or tongue unresponsive to 72 hours of home care or that are unusually large Care Advice: ~ 11/11/2011 12:00:29PM Page 1 of 1 [email or web address]

## 2011-11-12 NOTE — Telephone Encounter (Signed)
Please see phone note. Already addressed.

## 2012-05-14 ENCOUNTER — Other Ambulatory Visit: Payer: Self-pay | Admitting: Obstetrics and Gynecology

## 2012-05-14 DIAGNOSIS — R928 Other abnormal and inconclusive findings on diagnostic imaging of breast: Secondary | ICD-10-CM

## 2012-05-25 ENCOUNTER — Other Ambulatory Visit: Payer: Self-pay | Admitting: Obstetrics and Gynecology

## 2012-05-25 ENCOUNTER — Ambulatory Visit
Admission: RE | Admit: 2012-05-25 | Discharge: 2012-05-25 | Disposition: A | Discharge status: Home / Self Care | Payer: Self-pay | Source: Ambulatory Visit | Attending: Obstetrics and Gynecology | Admitting: Obstetrics and Gynecology

## 2012-05-25 DIAGNOSIS — R928 Other abnormal and inconclusive findings on diagnostic imaging of breast: Secondary | ICD-10-CM

## 2012-07-05 IMAGING — CR DG ELBOW COMPLETE 3+V*L*
1 series · 4 of 4 positions shown · non-contrast
Comparison: none

REASON FOR EXAM: trauma
COMMENTS:

PROCEDURE:     DXR - DXR ELBOW LT COMP W/OBLIQUES  - November 17, 2010 [DATE]
RESULT:     Comparison: None.

[Series 1: view not recorded · 0.17mm/px · 4 of 4 slices shown]
[im 1/4]
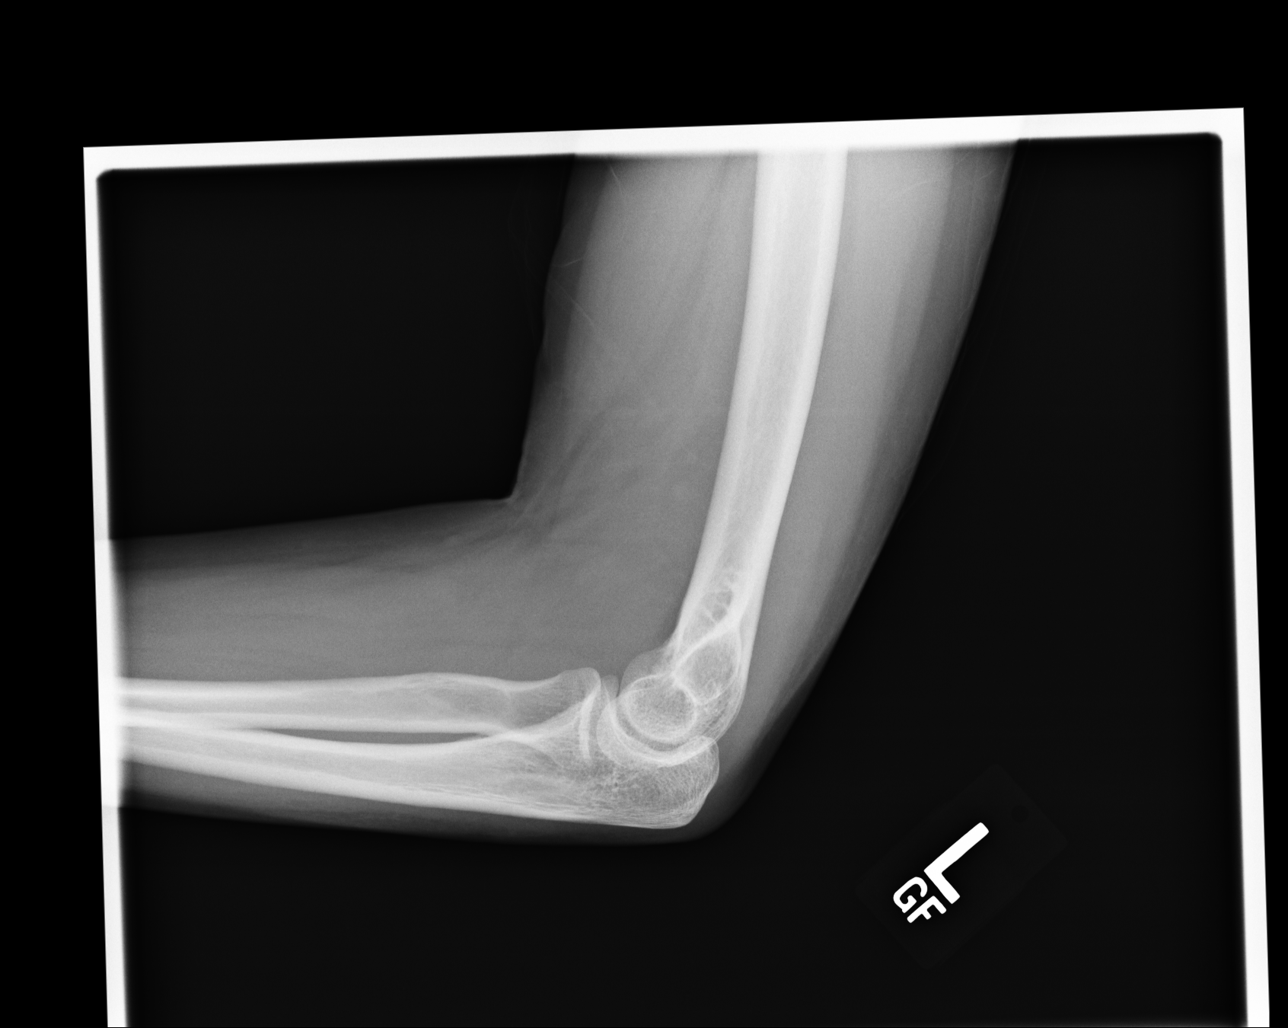
[im 2/4]
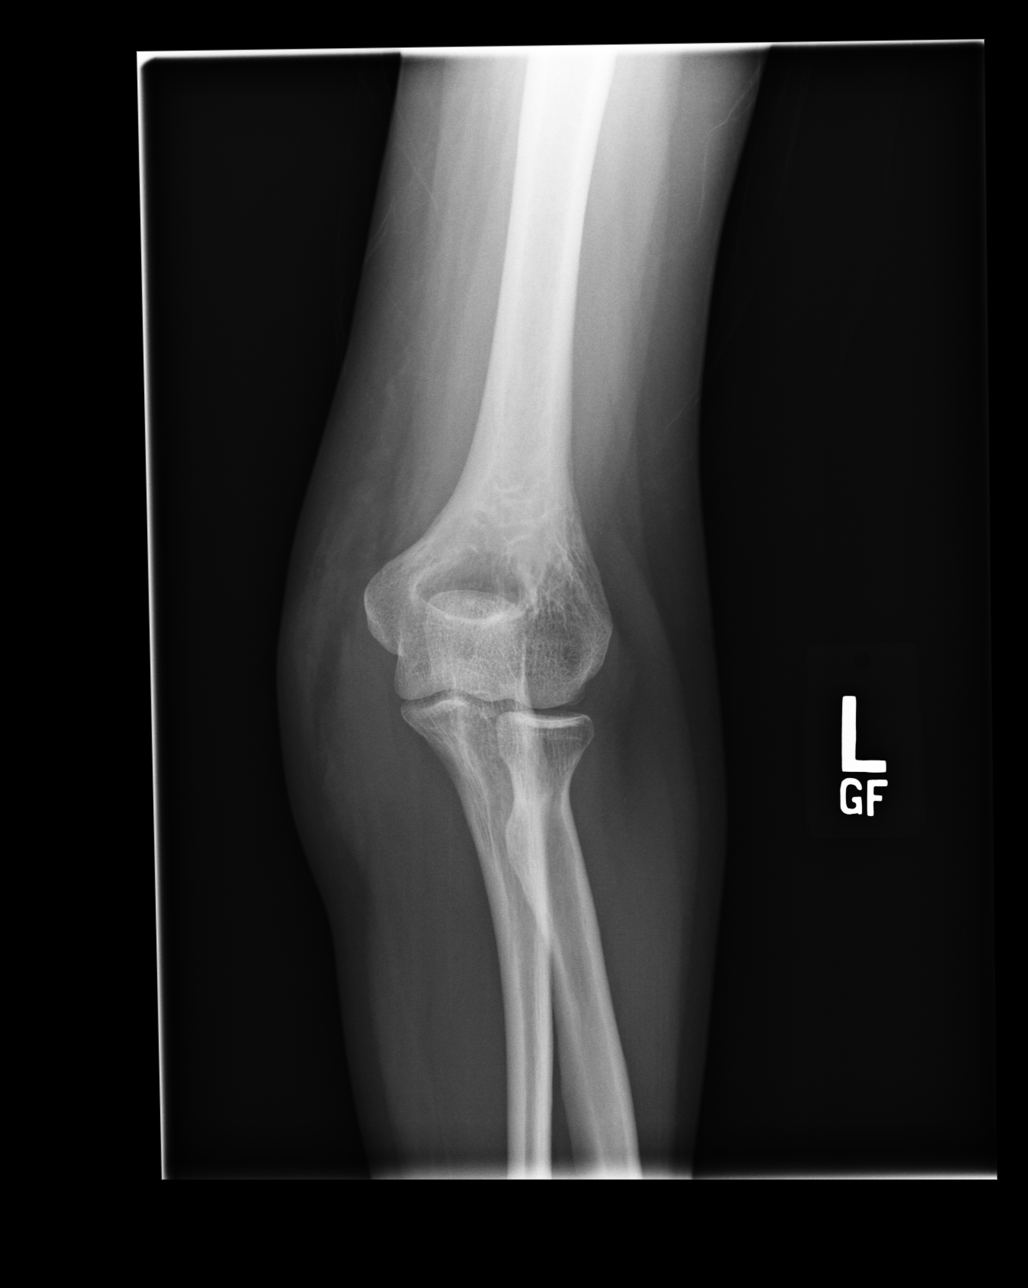
[im 3/4]
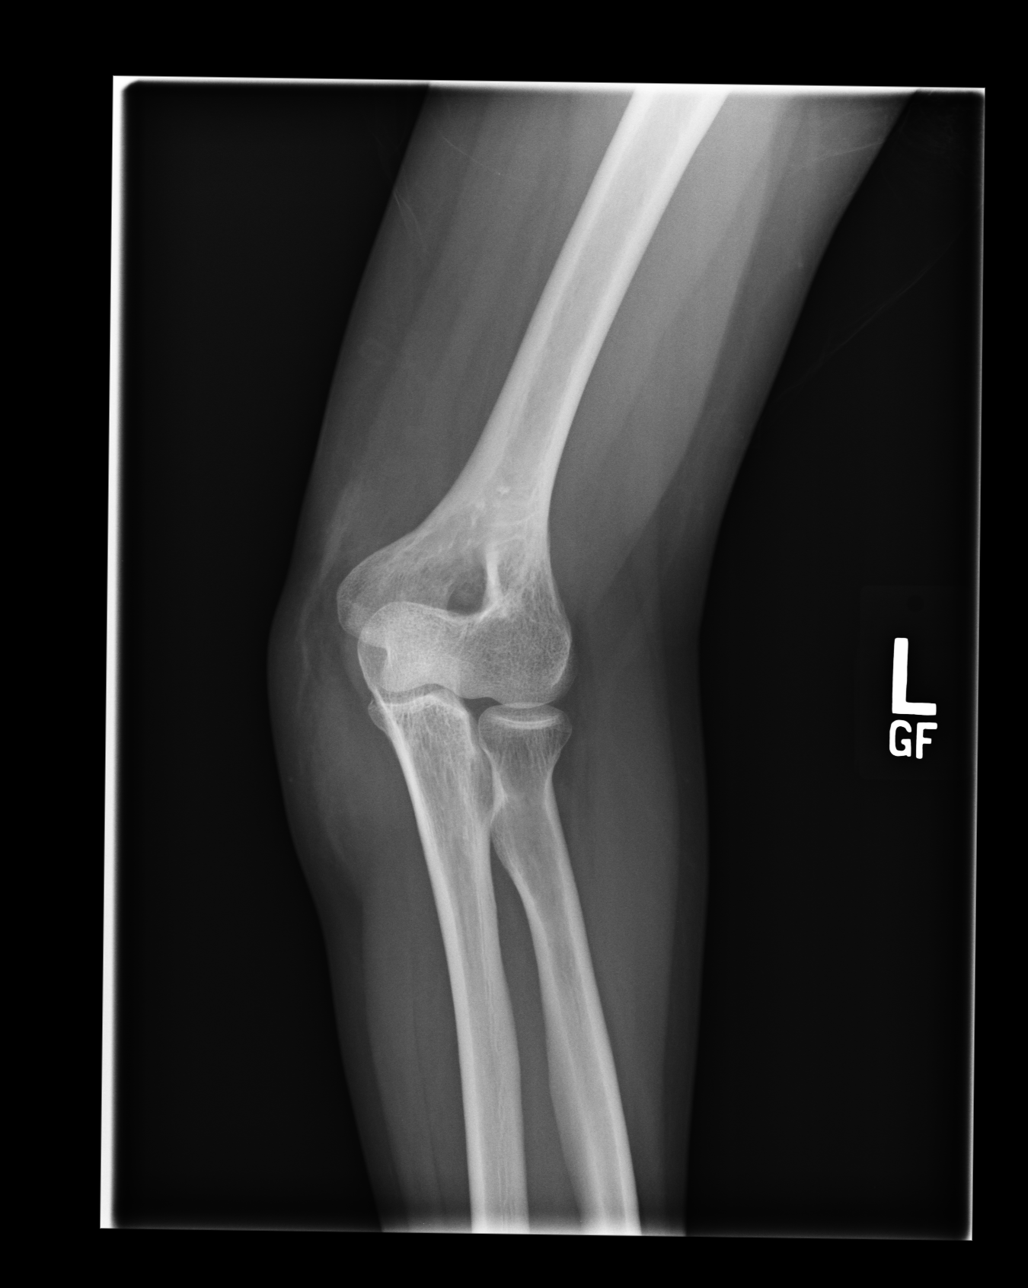
[im 4/4]
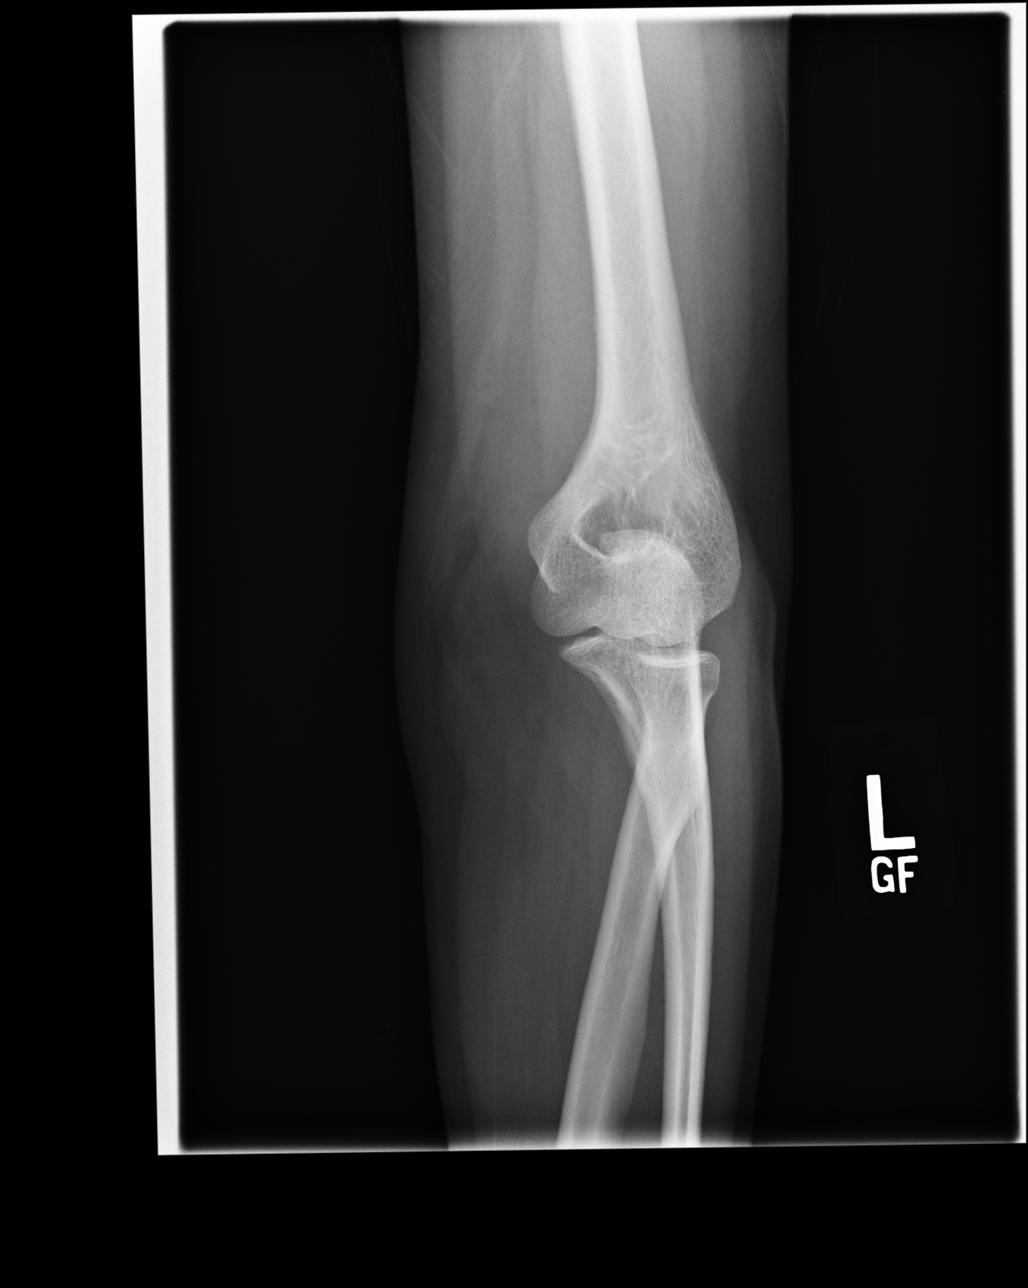

[4 of 4 positions shown; findings below may reference images not displayed]

FINDINGS: No elbow joint effusion. There is normal alignment. No acute fracture seen.
IMPRESSION: No acute fracture.

## 2012-07-13 ENCOUNTER — Ambulatory Visit (INDEPENDENT_AMBULATORY_CARE_PROVIDER_SITE_OTHER): Payer: Managed Care, Other (non HMO) | Admitting: Family Medicine

## 2012-07-13 ENCOUNTER — Encounter: Payer: Self-pay | Admitting: Family Medicine

## 2012-07-13 VITALS — BP 120/80 | HR 93 | Temp 98.3°F | Ht 62.0 in | Wt 163.5 lb

## 2012-07-13 DIAGNOSIS — J0100 Acute maxillary sinusitis, unspecified: Secondary | ICD-10-CM

## 2012-07-13 MED ORDER — AMOXICILLIN 500 MG PO CAPS: 1000.0000 mg | ORAL_CAPSULE | Freq: Two times a day (BID) | ORAL | Status: DC

## 2012-07-13 NOTE — Progress Notes (Signed)
Nature conservation officer at [facility] [address] [place] [address] Phone: [phone] Fax: [phone]  Date:  07/13/2012   Name:  Dominique Michael   DOB:  Jun 11, 1965   MRN:  [id number] Gender: female Age: 47 y.o.  Primary Physician:  Ruthe Mannan, MD  Evaluating MD: Hannah Beat, MD   Chief Complaint: Sinusitis   History of Present Illness:  Dominique Michael is a 47 y.o. pleasant patient who presents with the following:  47 yo: sinusitis. Had congestion now for 3 weeks, last few days, sinus pressure horrible. Going on a cruise on Friday.   Sinus Pain: Patient complains of congestion, facial pain, nasal congestion, post nasal drip, purulent nasal discharge and sinus pressure. Symptoms include above with no fever, chills, night sweats or weight loss. Onset of symptoms was 3 weeks ago, rapidly worsening since that time. She is drinking plenty of fluids.  Past history is significant for no history of pneumonia or bronchitis. Patient is non-smoker   Patient Active Problem List  Diagnosis  . Attention deficit disorder  . Cerumen impaction  . Thrush, oral    Past Medical History  Diagnosis Date  . Hypertension   . Asthma     prior to gastric bypass in 2003  . Vitiligo     Past Surgical History  Procedure Laterality Date  . Gastric bypass  2003  . Cesarean section    . Breast surgery      breast lift    History  Substance Use Topics  . Smoking status: Never Smoker   . Smokeless tobacco: Not on file  . Alcohol Use: No    Family History  Problem Relation Age of Onset  . Adopted: Yes    Allergies  Allergen Reactions  . Sulfonamide Derivatives     REACTION: Severe reaction.  Avoids foods with Sulfa content.    Medication list has been reviewed and updated.  No outpatient prescriptions prior to visit.   No facility-administered medications prior to visit.    Review of Systems:  ROS: GEN: Acute illness details above GI: Tolerating PO  intake GU: maintaining adequate hydration and urination Pulm: No SOB Interactive and getting along well at home.  Otherwise, ROS is as per the HPI.   Physical Examination: BP 120/80  Pulse 93  Temp(Src) 98.3 F (36.8 C) (Oral)  Ht 5\' 2"  (1.575 m)  Wt 163 lb 8 oz (74.163 kg)  BMI 29.9 kg/m2  SpO2 98%  Ideal Body Weight: Weight in (lb) to have BMI = 25: 136.4   Gen: WDWN, NAD; alert,appropriate and cooperative throughout exam  HEENT: Normocephalic and atraumatic. Throat clear, w/o exudate, no LAD, R TM clear, L TM - good landmarks, No fluid present. rhinnorhea.  Left frontal and maxillary sinuses: Tender Right frontal and maxillary sinuses: Tender  Neck: No ant or post LAD CV: RRR, No M/G/R Pulm: Breathing comfortably in no resp distress. no w/c/r Abd: S,NT,ND,+BS Extr: no c/c/e Psych: full affect, pleasant   Assessment and Plan:  1. Acute maxillary sinusitis   Acute sinusitis: ABX as below.  Severe worsening, sinusitis Reviewed symptomatic care as well as ABX in this case.     Orders Today:  No orders of the defined types were placed in this encounter.    Updated Medication List: (Includes new medications, updates to list, dose adjustments) Meds ordered this encounter  Medications  . amoxicillin (AMOXIL) 500 MG capsule    Sig: Take 2 capsules (1,000 mg total) by  mouth 2 (two) times daily.    Dispense:  40 capsule    Refill:  0    Medications Discontinued: There are no discontinued medications.   Signed, Elpidio Galea. Latayna Ritchie, MD 07/13/2012 12:19 PM

## 2013-03-08 ENCOUNTER — Ambulatory Visit (INDEPENDENT_AMBULATORY_CARE_PROVIDER_SITE_OTHER): Payer: Managed Care, Other (non HMO) | Admitting: Family Medicine

## 2013-03-08 ENCOUNTER — Encounter: Payer: Self-pay | Admitting: Family Medicine

## 2013-03-08 VITALS — BP 120/80 | HR 94 | Temp 98.6°F | Wt 168.0 lb

## 2013-03-08 DIAGNOSIS — J329 Chronic sinusitis, unspecified: Secondary | ICD-10-CM

## 2013-03-08 MED ORDER — FLUTICASONE PROPIONATE 50 MCG/ACT NA SUSP: 2.0000 | Freq: Every day | NASAL | Status: AC

## 2013-03-08 MED ORDER — AMOXICILLIN-POT CLAVULANATE 875-125 MG PO TABS: 1.0000 | ORAL_TABLET | Freq: Two times a day (BID) | ORAL | Status: DC

## 2013-03-08 NOTE — Assessment & Plan Note (Signed)
Presumed, nontoxic, augmentin and f/u prn.  Continue flonase.

## 2013-03-08 NOTE — Patient Instructions (Addendum)
Keep using the flonase and start augmentin.  Take care. Try to get some rest.   This should gradually improve.

## 2013-03-08 NOTE — Progress Notes (Signed)
Mother of 3 kids.  They are well.    duration of symptoms: about 3 weeks.  Started with scratchy throat.  Now with likely fevers, cough, alternating chills and sweats.  Voice is altered. B ear pain.  Facial pain and HA recently.  Stuffy.  No sputum. Some cough.   L>R ear pain.   other concerns:used flonase and benadryl w/o much relief.    ROS: See HPI.  Otherwise negative.    Meds, vitals, and allergies reviewed.   GEN: nad, alert and oriented HEENT: mucous membranes moist, TM w/o erythema, nasal epithelium injected, OP with cobblestoning, max sinuses ttp NECK: supple w/o LA CV: rrr. PULM: ctab, no inc wob ABD: soft, +bs EXT: no edema

## 2013-06-01 ENCOUNTER — Encounter: Payer: Self-pay | Admitting: Family Medicine

## 2013-06-01 ENCOUNTER — Ambulatory Visit: Payer: Managed Care, Other (non HMO) | Admitting: Family Medicine

## 2013-06-01 ENCOUNTER — Ambulatory Visit (INDEPENDENT_AMBULATORY_CARE_PROVIDER_SITE_OTHER): Payer: Managed Care, Other (non HMO) | Admitting: Family Medicine

## 2013-06-01 VITALS — BP 126/78 | HR 74 | Temp 98.2°F | Wt 167.8 lb

## 2013-06-01 DIAGNOSIS — J329 Chronic sinusitis, unspecified: Secondary | ICD-10-CM

## 2013-06-01 MED ORDER — AZITHROMYCIN 250 MG PO TABS: ORAL_TABLET | ORAL | Status: DC

## 2013-06-01 NOTE — Patient Instructions (Signed)
Start the antibiotics today.  Drink plenty of fluids, take tylenol as needed, and gargle with warm salt water for your throat.  This should gradually improve.  Take care.  Let us know if you have other concerns.  

## 2013-06-01 NOTE — Assessment & Plan Note (Signed)
Nontoxic, likely sinusitis.  Would use zmax. May need a 2nd course depending on her response- this has happened prev.  She tends to tolerate zmax better than other abx after the gastric bypass. Supportive care o/w. F/u prn.  She agrees.  Call back as needed.

## 2013-06-01 NOTE — Progress Notes (Signed)
Pre-visit discussion using our clinic review tool. No additional management support is needed unless otherwise documented below in the visit note.  Almost 3 weeks of sx.  Started with laryngitis.  Voice recovered then had ST.  No chest sx.  Facial pain, frontal or maxillary.  No fevers known.  No acute ear pain.  No rhinorrhea.  Mildly stuffy at night.    Meds, vitals, and allergies reviewed.   ROS: See HPI.  Otherwise, noncontributory.  GEN: nad, alert and oriented HEENT: mucous membranes moist, tm w/o erythema, nasal exam w/o erythema, clear discharge noted,  OP with cobblestoning, sinuses ttp NECK: supple w/o LA CV: rrr.   PULM: ctab, no inc wob EXT: no edema SKIN: no acute rash

## 2013-06-07 ENCOUNTER — Other Ambulatory Visit: Payer: Self-pay

## 2013-06-07 MED ORDER — AZITHROMYCIN 250 MG PO TABS: ORAL_TABLET | ORAL | Status: DC

## 2013-06-07 NOTE — Telephone Encounter (Signed)
Sent!

## 2013-06-07 NOTE — Telephone Encounter (Signed)
Patient advised.

## 2013-06-07 NOTE — Telephone Encounter (Signed)
Pt left v/m pt was seen 06/01/13 and pt took Z pack (pt has had gastric surgery and does not absorb meds well; pt discussed with Dr Para Marchuncan about possible 2nd prescription of Z Pak. [facility] and pt request cb.

## 2013-06-10 ENCOUNTER — Other Ambulatory Visit (HOSPITAL_COMMUNITY): Payer: Self-pay | Admitting: *Deleted

## 2013-06-11 ENCOUNTER — Encounter (HOSPITAL_COMMUNITY)
Admission: RE | Admit: 2013-06-11 | Discharge: 2013-06-11 | Disposition: A | Discharge status: Home / Self Care | Payer: Managed Care, Other (non HMO) | Source: Ambulatory Visit | Attending: Obstetrics and Gynecology | Admitting: Obstetrics and Gynecology

## 2013-06-11 MED ORDER — FERUMOXYTOL INJECTION 510 MG/17 ML
INTRAVENOUS | Status: AC
  Administered 2013-06-11: 510 mg via INTRAVENOUS
  Filled 2013-06-11: qty 17

## 2013-06-11 MED ORDER — SODIUM CHLORIDE 0.9 % IV SOLN
INTRAVENOUS | Status: DC
  Administered 2013-06-11: 250 mL via INTRAVENOUS

## 2013-06-11 MED ORDER — FERUMOXYTOL INJECTION 510 MG/17 ML
510.0000 mg | INTRAVENOUS | Status: DC
  Administered 2013-06-11: 510 mg via INTRAVENOUS

## 2013-06-14 ENCOUNTER — Encounter (HOSPITAL_COMMUNITY)
Admission: RE | Admit: 2013-06-14 | Discharge: 2013-06-14 | Disposition: A | Discharge status: Home / Self Care | Payer: Managed Care, Other (non HMO) | Source: Ambulatory Visit | Attending: Obstetrics and Gynecology | Admitting: Obstetrics and Gynecology

## 2013-06-14 MED ORDER — SODIUM CHLORIDE 0.9 % IV SOLN
INTRAVENOUS | Status: AC
  Administered 2013-06-14: 11:00:00 via INTRAVENOUS

## 2013-06-14 MED ORDER — FERUMOXYTOL INJECTION 510 MG/17 ML
510.0000 mg | INTRAVENOUS | Status: AC
  Administered 2013-06-14: 510 mg via INTRAVENOUS

## 2013-06-14 MED ORDER — FERUMOXYTOL INJECTION 510 MG/17 ML
INTRAVENOUS | Status: AC
  Filled 2013-06-14: qty 17

## 2014-02-28 ENCOUNTER — Ambulatory Visit (INDEPENDENT_AMBULATORY_CARE_PROVIDER_SITE_OTHER): Payer: Managed Care, Other (non HMO) | Admitting: Family Medicine

## 2014-02-28 ENCOUNTER — Encounter: Payer: Self-pay | Admitting: Family Medicine

## 2014-02-28 VITALS — BP 114/78 | HR 74 | Temp 98.4°F | Wt 163.0 lb

## 2014-02-28 DIAGNOSIS — J0110 Acute frontal sinusitis, unspecified: Secondary | ICD-10-CM

## 2014-02-28 MED ORDER — AZITHROMYCIN 250 MG PO TABS: ORAL_TABLET | ORAL | Status: DC

## 2014-02-28 NOTE — Assessment & Plan Note (Signed)
Nontoxic.  Would use zmax. May need a 2nd course depending on her response- this has happened prev. She tends to tolerate zmax better than other abx after the gastric bypass. Supportive care o/w. F/u prn. She agrees. Call back as needed.

## 2014-02-28 NOTE — Progress Notes (Signed)
Pre visit review using our clinic review tool, if applicable. No additional management support is needed unless otherwise documented below in the visit note.  duration of symptoms: 2 weeks rhinorrhea:no Congestion: less with flonase use.  ear pain: some, mild sore throat: yes, recently.   Cough:minimally recently.   Myalgias: no Hot and cold chills noted recently.  No fevers known.  other concerns: persistent facial/frontal HA.  Sleep disrupted.  Mult sick contacts.  ROS: See HPI.  Otherwise negative.    Meds, vitals, and allergies reviewed.   GEN: nad, alert and oriented HEENT: mucous membranes moist, TM w/o erythema, nasal epithelium injected, OP with cobblestoning, sinuses ttp x4 NECK: supple w/o LA CV: rrr. PULM: ctab, no inc wob ABD: soft, +bs EXT: no edema

## 2014-02-28 NOTE — Patient Instructions (Signed)
Keep taking the flonase and start zithromax.  Take care.  Try to get some rest and drink plenty of fluids.

## 2014-03-14 ENCOUNTER — Other Ambulatory Visit: Payer: Self-pay | Admitting: Family Medicine

## 2014-03-14 ENCOUNTER — Telehealth: Payer: Self-pay | Admitting: Family Medicine

## 2014-03-14 MED ORDER — AZITHROMYCIN 250 MG PO TABS: ORAL_TABLET | ORAL | Status: DC

## 2014-03-14 NOTE — Telephone Encounter (Signed)
Sent. Thanks.   

## 2014-03-14 NOTE — Telephone Encounter (Signed)
Pt called stating she is not feeling any better from her sinus infection. States since she is a gastric bypass pt it usually takes 2 does of medication to clear up infections. Pt request another round of abx.  [phone]  [facility]

## 2014-04-06 ENCOUNTER — Ambulatory Visit (INDEPENDENT_AMBULATORY_CARE_PROVIDER_SITE_OTHER): Payer: Managed Care, Other (non HMO) | Admitting: Internal Medicine

## 2014-04-06 ENCOUNTER — Encounter: Payer: Self-pay | Admitting: Internal Medicine

## 2014-04-06 VITALS — BP 134/88 | HR 71 | Temp 98.8°F | Wt 168.0 lb

## 2014-04-06 DIAGNOSIS — J020 Streptococcal pharyngitis: Secondary | ICD-10-CM

## 2014-04-06 LAB — POCT RAPID STREP A (OFFICE): RAPID STREP A SCREEN: POSITIVE — AB

## 2014-04-06 MED ORDER — PENICILLIN G BENZATHINE 1200000 UNIT/2ML IM SUSP
1.2000 10*6.[IU] | Freq: Once | INTRAMUSCULAR | Status: AC
  Administered 2014-04-06: 1.2 10*6.[IU] via INTRAMUSCULAR

## 2014-04-06 NOTE — Progress Notes (Signed)
Pre visit review using our clinic review tool, if applicable. No additional management support is needed unless otherwise documented below in the visit note. 

## 2014-04-06 NOTE — Addendum Note (Signed)
Addended by: Roena MaladyEVONTENNO, Chauntelle Azpeitia Y on: 04/06/2014 04:10 PM   Modules accepted: Orders

## 2014-04-06 NOTE — Progress Notes (Signed)
   Subjective:    Patient ID: Dominique Michael, female    DOB: 04/08/1966, 48 y.o.   MRN: [id number]  HPI  Pt presents to the clinic today with c/o right sided sore throat and right ear pain. She reports this started 5 days ago. She denies difficulty swallowing. She denies fever, chills or body aches. She has tried salt water gargles. She has not had sick contacts. She reports that she has had strep throat in the past and this feels the same.  Review of Systems      Past Medical History  Diagnosis Date  . Hypertension   . Asthma     prior to gastric bypass in 2003  . Vitiligo     Current Outpatient Prescriptions  Medication Sig Dispense Refill  . fluticasone (FLONASE) 50 MCG/ACT nasal spray Place 2 sprays into the nose daily.     No current facility-administered medications for this visit.    Allergies  Allergen Reactions  . Diflucan [Fluconazole]     itching  . Sulfonamide Derivatives     REACTION: Severe reaction.  Avoids foods with Sulfa content.    Family History  Problem Relation Age of Onset  . Adopted: Yes    History   Social History  . Marital Status: Married    Spouse Name: N/A    Number of Children: N/A  . 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 Smoking status: Former Games developermoker  . Smokeless tobacco: Never Used     Comment: quit over 20 years ago  . Alcohol Use: No  . Drug Use: No  . Sexual Activity: Yes   Other Topics Concern  . Not on file   Social History Narrative     Constitutional: Denies fever, malaise, fatigue, headache or abrupt weight changes.  HEENT: Pt reports ear pain, sore throat. Denies eye pain, eye redness, ear pain, ringing in the ears, wax buildup, runny nose, nasal congestion, bloody nose Respiratory: Denies difficulty breathing, shortness of breath, cough or sputum production.   Cardiovascular: Denies chest pain, chest tightness, palpitations or swelling  in the hands or feet.     No other specific complaints in a complete review of systems (except as listed in HPI above).  Objective:   Physical Exam  BP 134/88 mmHg  Pulse 71  Temp(Src) 98.8 F (37.1 C)  Wt 168 lb (76.204 kg)  SpO2 98% Wt Readings from Last 3 Encounters:  04/06/14 168 lb (76.204 kg)  02/28/14 163 lb (73.936 kg)  06/11/13 160 lb (72.576 kg)    General: Appears her stated age, well developed, well nourished in NAD. HEENT: Head: normal shape and size; Ears: Tm's pink and intact, normal light reflex; Nose: mucosa pink and moist, septum midline; Throat/Mouth: Teeth present, mucosa erythematous and moist, tonsils enlarged R>L, no exudate, lesions or ulcerations noted.  Cardiovascular: Normal rate and rhythm. S1,S2 noted.  No murmur, rubs or gallops noted.  Pulmonary/Chest: Normal effort and positive vesicular breath sounds. No respiratory distress. No wheezes, rales or ronchi noted.      Assessment & Plan:   Strep pharyngitis:  RST faintly positive She does not want to take oral antibiotics as it gives her severe yeast infection Will give 1.2 million units Pen G IM today Ibuprofen and salt water gargles as needed for comfort  RTC as needed or if symptoms persist or worsen

## 2014-04-06 NOTE — Patient Instructions (Signed)

## 2014-05-25 ENCOUNTER — Encounter: Payer: Self-pay | Admitting: Internal Medicine

## 2014-05-25 ENCOUNTER — Ambulatory Visit (INDEPENDENT_AMBULATORY_CARE_PROVIDER_SITE_OTHER): Payer: Managed Care, Other (non HMO) | Admitting: Internal Medicine

## 2014-05-25 VITALS — BP 122/84 | HR 74 | Temp 98.4°F | Wt 170.0 lb

## 2014-05-25 DIAGNOSIS — J069 Acute upper respiratory infection, unspecified: Secondary | ICD-10-CM

## 2014-05-25 MED ORDER — AZITHROMYCIN 250 MG PO TABS: ORAL_TABLET | ORAL | Status: DC

## 2014-05-25 NOTE — Patient Instructions (Signed)
Upper Respiratory Infection, Adult An upper respiratory infection (URI) is also sometimes known as the common cold. The upper respiratory tract includes the nose, sinuses, throat, trachea, and bronchi. Bronchi are the airways leading to the lungs. Most people improve within 1 week, but symptoms can last up to 2 weeks. A residual cough may last even longer.  CAUSES Many different viruses can infect the tissues lining the upper respiratory tract. The tissues become irritated and inflamed and often become very moist. Mucus production is also common. A cold is contagious. You can easily spread the virus to others by oral contact. This includes kissing, sharing a glass, coughing, or sneezing. Touching your mouth or nose and then touching a surface, which is then touched by another person, can also spread the virus. SYMPTOMS  Symptoms typically develop 1 to 3 days after you come in contact with a cold virus. Symptoms vary from person to person. They may include:  Runny nose.  Sneezing.  Nasal congestion.  Sinus irritation.  Sore throat.  Loss of voice (laryngitis).  Cough.  Fatigue.  Muscle aches.  Loss of appetite.  Headache.  Low-grade fever. DIAGNOSIS  You might diagnose your own cold based on familiar symptoms, since most people get a cold 2 to 3 times a year. Your caregiver can confirm this based on your exam. Most importantly, your caregiver can check that your symptoms are not due to another disease such as strep throat, sinusitis, pneumonia, asthma, or epiglottitis. Blood tests, throat tests, and X-rays are not necessary to diagnose a common cold, but they may sometimes be helpful in excluding other more serious diseases. Your caregiver will decide if any further tests are required. RISKS AND COMPLICATIONS  You may be at risk for a more severe case of the common cold if you smoke cigarettes, have chronic heart disease (such as heart failure) or lung disease (such as asthma), or if  you have a weakened immune system. The very young and very old are also at risk for more serious infections. Bacterial sinusitis, middle ear infections, and bacterial pneumonia can complicate the common cold. The common cold can worsen asthma and chronic obstructive pulmonary disease (COPD). Sometimes, these complications can require emergency medical care and may be life-threatening. PREVENTION  The best way to protect against getting a cold is to practice good hygiene. Avoid oral or hand contact with people with cold symptoms. Wash your hands often if contact occurs. There is no clear evidence that vitamin C, vitamin E, echinacea, or exercise reduces the chance of developing a cold. However, it is always recommended to get plenty of rest and practice good nutrition. TREATMENT  Treatment is directed at relieving symptoms. There is no cure. Antibiotics are not effective, because the infection is caused by a virus, not by bacteria. Treatment may include:  Increased fluid intake. Sports drinks offer valuable electrolytes, sugars, and fluids.  Breathing heated mist or steam (vaporizer or shower).  Eating chicken soup or other clear broths, and maintaining good nutrition.  Getting plenty of rest.  Using gargles or lozenges for comfort.  Controlling fevers with ibuprofen or acetaminophen as directed by your caregiver.  Increasing usage of your inhaler if you have asthma. Zinc gel and zinc lozenges, taken in the first 24 hours of the common cold, can shorten the duration and lessen the severity of symptoms. Pain medicines may help with fever, muscle aches, and throat pain. A variety of non-prescription medicines are available to treat congestion and runny nose. Your caregiver   can make recommendations and may suggest nasal or lung inhalers for other symptoms.  HOME CARE INSTRUCTIONS   Only take over-the-counter or prescription medicines for pain, discomfort, or fever as directed by your  caregiver.  Use a warm mist humidifier or inhale steam from a shower to increase air moisture. This may keep secretions moist and make it easier to breathe.  Drink enough water and fluids to keep your urine clear or pale yellow.  Rest as needed.  Return to work when your temperature has returned to normal or as your caregiver advises. You may need to stay home longer to avoid infecting others. You can also use a face mask and careful hand washing to prevent spread of the virus. SEEK MEDICAL CARE IF:   After the first few days, you feel you are getting worse rather than better.  You need your caregiver's advice about medicines to control symptoms.  You develop chills, worsening shortness of breath, or brown or red sputum. These may be signs of pneumonia.  You develop yellow or brown nasal discharge or pain in the face, especially when you bend forward. These may be signs of sinusitis.  You develop a fever, swollen neck glands, pain with swallowing, or white areas in the back of your throat. These may be signs of strep throat. SEEK IMMEDIATE MEDICAL CARE IF:   You have a fever.  You develop severe or persistent headache, ear pain, sinus pain, or chest pain.  You develop wheezing, a prolonged cough, cough up blood, or have a change in your usual mucus (if you have chronic lung disease).  You develop sore muscles or a stiff neck. Document Released: 11/13/2000 Document Revised: 08/12/2011 Document Reviewed: 08/25/2013 Exit[employer] Patient Information 2015 [employer]. This information is not intended to replace advice given to you by your health care provider. Make sure you discuss any questions you have with your health care provider.  

## 2014-05-25 NOTE — Progress Notes (Signed)
HPI  Pt presents to the clinic today with c/o headache, ear fullness, runny nose, nasal congestion, sore throat and cough. She reports this started 5-6 days ago. The cough is productive of green mucous. She denies fever but has had chills and body aches. She has tried Engineer, agriculturaludafed and Flonase without relief. She has no history of seasonal allergies but has had some asthma. She has had sick contacts.  Review of Systems    Past Medical History  Diagnosis Date  . Hypertension   . Asthma     prior to gastric bypass in 2003  . Vitiligo     Family History  Problem Relation Age of Onset  . Adopted: Yes    History   Social History  . Marital Status: Married    Spouse Name: N/A    Number of Children: N/A  . 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 Smoking status: Former Games developermoker  . Smokeless tobacco: Never Used     Comment: quit over 20 years ago  . Alcohol Use: No  . Drug Use: No  . Sexual Activity: Yes   Other Topics Concern  . Not on file   Social History Narrative    Allergies  Allergen Reactions  . Diflucan [Fluconazole]     itching  . Sulfonamide Derivatives     REACTION: Severe reaction.  Avoids foods with Sulfa content.     Constitutional: Positive headache, fatigue  Denies fever or abrupt weight changes.  HEENT:  Positive ear pressure, facial pain, nasal congestion and sore throat. Denies eye redness, ear pain, ringing in the ears, wax buildup,  or bloody nose. Respiratory: Positive cough. Denies difficulty breathing or shortness of breath.  Cardiovascular: Denies chest pain, chest tightness, palpitations or swelling in the hands or feet.   No other specific complaints in a complete review of systems (except as listed in HPI above).  Objective:  BP 122/84 mmHg  Pulse 74  Temp(Src) 98.4 F (36.9 C) (Oral)  Wt 170 lb (77.111 kg)  SpO2 98%   General: Appears her stated age, will appearing  in NAD. HEENT: Head: normal shape and size, no sinus tenderness noted; Eyes: sclera white, no icterus, conjunctiva pink; Ears: Tm's gray and intact, normal light reflex, + serous effusion bilaterally; Nose: mucosa pink and moist, septum midline; Throat/Mouth: + PND. Teeth present, mucosa erythematous and moist, no exudate noted, no lesions or ulcerations noted.  Neck: Cervical adenopathy noted. Cardiovascular: Normal rate and rhythm. S1,S2 noted.  No murmur, rubs or gallops noted. Pulmonary/Chest: Normal effort and positive vesicular breath sounds. No respiratory distress. No wheezes, rales or ronchi noted.      Assessment & Plan:   Upper respiratory infection  Salt water gargles and ibuprofen for sore throat Continue flonase eRx for azithromax x 5 days She has left over tussionex to use for the cough  RTC as needed or if symptoms persist.

## 2014-05-25 NOTE — Progress Notes (Signed)
Pre visit review using our clinic review tool, if applicable. No additional management support is needed unless otherwise documented below in the visit note. 

## 2014-08-17 ENCOUNTER — Encounter: Payer: Self-pay | Admitting: Family Medicine

## 2014-08-17 ENCOUNTER — Ambulatory Visit (INDEPENDENT_AMBULATORY_CARE_PROVIDER_SITE_OTHER): Payer: Managed Care, Other (non HMO) | Admitting: Family Medicine

## 2014-08-17 VITALS — BP 152/90 | HR 70 | Temp 98.1°F | Wt 170.8 lb

## 2014-08-17 DIAGNOSIS — I10 Essential (primary) hypertension: Secondary | ICD-10-CM | POA: Insufficient documentation

## 2014-08-17 DIAGNOSIS — IMO0001 Reserved for inherently not codable concepts without codable children: Secondary | ICD-10-CM

## 2014-08-17 DIAGNOSIS — R030 Elevated blood-pressure reading, without diagnosis of hypertension: Secondary | ICD-10-CM

## 2014-08-17 DIAGNOSIS — J029 Acute pharyngitis, unspecified: Secondary | ICD-10-CM

## 2014-08-17 LAB — CBC WITH DIFFERENTIAL/PLATELET
BASOS ABS: 0 10*3/uL (ref 0.0–0.1)
BASOS PCT: 0.7 % (ref 0.0–3.0)
EOS ABS: 0.2 10*3/uL (ref 0.0–0.7)
EOS PCT: 2.7 % (ref 0.0–5.0)
HEMATOCRIT: 31.1 % — AB (ref 36.0–46.0)
Hemoglobin: 10 g/dL — ABNORMAL LOW (ref 12.0–15.0)
LYMPHS ABS: 1.6 10*3/uL (ref 0.7–4.0)
Lymphocytes Relative: 23.9 % (ref 12.0–46.0)
MCHC: 32.1 g/dL (ref 30.0–36.0)
MONO ABS: 0.4 10*3/uL (ref 0.1–1.0)
MONOS PCT: 6.5 % (ref 3.0–12.0)
Neutro Abs: 4.3 10*3/uL (ref 1.4–7.7)
Neutrophils Relative %: 66.2 % (ref 43.0–77.0)
Platelets: 447 10*3/uL — ABNORMAL HIGH (ref 150.0–400.0)
RBC: 4.69 Mil/uL (ref 3.87–5.11)
RDW: 18 % — ABNORMAL HIGH (ref 11.5–15.5)
WBC: 6.5 10*3/uL (ref 4.0–10.5)

## 2014-08-17 LAB — COMPREHENSIVE METABOLIC PANEL
ALBUMIN: 4.3 g/dL (ref 3.5–5.2)
ALK PHOS: 64 U/L (ref 39–117)
ALT: 14 U/L (ref 0–35)
AST: 18 U/L (ref 0–37)
BILIRUBIN TOTAL: 0.5 mg/dL (ref 0.2–1.2)
BUN: 9 mg/dL (ref 6–23)
CO2: 30 mEq/L (ref 19–32)
Calcium: 9.5 mg/dL (ref 8.4–10.5)
Chloride: 104 mEq/L (ref 96–112)
Creatinine, Ser: 0.63 mg/dL (ref 0.40–1.20)
GFR: 106.27 mL/min (ref >60.00)
GLUCOSE: 75 mg/dL (ref 70–99)
Potassium: 4.4 mEq/L (ref 3.5–5.1)
Sodium: 137 mEq/L (ref 135–145)
TOTAL PROTEIN: 7.1 g/dL (ref 6.0–8.3)

## 2014-08-17 LAB — TSH: TSH: 1.44 u[IU]/mL (ref 0.35–4.50)

## 2014-08-17 LAB — POCT RAPID STREP A (OFFICE): RAPID STREP A SCREEN: NEGATIVE

## 2014-08-17 NOTE — Progress Notes (Signed)
Pre visit review using our clinic review tool, if applicable. No additional management support is needed unless otherwise documented below in the visit note. 

## 2014-08-17 NOTE — Addendum Note (Signed)
Addended by: Desmond DikeKNIGHT, Kayven Aldaco H on: 08/17/2014 12:09 PM   Modules accepted: Orders

## 2014-08-17 NOTE — Patient Instructions (Signed)
Good to see you. Please check your blood pressure at home this evening and again tomorrow morning- call us tomorrow with your readings or go to the ER if BP is very elevated again- 180s/100s like when you came in or if you develop any new symptoms.  We will call you with your lab results.

## 2014-08-17 NOTE — Assessment & Plan Note (Signed)
New onset- Improved during office visit. Asymptomatic. Check labs today.  She will monitor her BP at home, if goes back up or if she develops symptoms, she will go to ER.  Orders Placed This Encounter  Procedures  . Comprehensive metabolic panel  . TSH  . CBC with Differential/Platelet

## 2014-08-17 NOTE — Progress Notes (Signed)
Subjective:   Patient ID: Dominique Michael, female    DOB: 01/04/1966, 49 y.o.   MRN: [id number]  Dominique AlstromMarianna E Heitman is a pleasant 49 y.o. year old female who presents to clinic today with Sore Throat  on 08/17/2014  HPI:  Sore throat- acute onset last night.   Has had some increased congestion.  Daughter did test positve for strep throat last week. Denies fevers, cough or difficulty swallowing. Does have seasonal allergies- taking Flonase.  Elevated blood pressure- asymptomatic.  BP was 186/125 when she arrived here.  No h/o elevated BP.  Decreased to 152/90 during her office visit. No HA, no blurred vision, no CP or SOB. Denies taking any decongestants, caffeine, or other medications or illegal substances. Has been under more stress with her daughters' cheerleading competitions.  BP Readings from Last 3 Encounters:  08/17/14 186/125  05/25/14 122/84  04/06/14 134/88   Current Outpatient Prescriptions on File Prior to Visit  Medication Sig Dispense Refill  . fluticasone (FLONASE) 50 MCG/ACT nasal spray Place 2 sprays into the nose daily.     No current facility-administered medications on file prior to visit.    Allergies  Allergen Reactions  . Diflucan [Fluconazole]     itching  . Sulfonamide Derivatives     REACTION: Severe reaction.  Avoids foods with Sulfa content.    Past Medical History  Diagnosis Date  . Hypertension   . Asthma     prior to gastric bypass in 2003  . Vitiligo     Past Surgical History  Procedure Laterality Date  . Gastric bypass  2003  . Cesarean section    . Breast surgery      breast lift    Family History  Problem Relation Age of Onset  . Adopted: Yes    History   Social History  . Marital Status: Married    Spouse Name: N/A  . Number of Children: N/A  . 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 Smoking status: Former Games developermoker  . Smokeless tobacco: Never  Used     Comment: quit over 20 years ago  . Alcohol Use: No  . Drug Use: No  . Sexual Activity: Yes   Other Topics Concern  . Not on file   Social History Narrative   The PMH, PSH, Social History, Family History, Medications, and allergies [facility], and have been updated if relevant.   Review of Systems  Constitutional: Negative.   HENT: Positive for sore throat. Negative for rhinorrhea, sinus pressure, trouble swallowing and voice change.   Eyes: Negative.   Respiratory: Negative.   Cardiovascular: Negative.   Gastrointestinal: Negative.   Endocrine: Negative.   Genitourinary: Negative.   Musculoskeletal: Negative.   Allergic/Immunologic: Negative.   Neurological: Negative.   Hematological: Negative.   Psychiatric/Behavioral: The patient is nervous/anxious.   All other systems reviewed and are negative.      Objective:    BP 186/125 mmHg  Pulse 70  Temp(Src) 98.1 F (36.7 C) (Oral)  Wt 170 lb 12 oz (77.452 kg)  SpO2 98%  LMP 07/24/2014   Physical Exam  Constitutional: She is oriented to person, place, and time. She appears well-developed and well-nourished. No distress.  HENT:  Head: Normocephalic.  Right Ear: Hearing and tympanic membrane normal.  Left Ear: Hearing and tympanic membrane normal.  Nose: Nose normal.  Mouth/Throat: Mucous  membranes are normal. Posterior oropharyngeal erythema present. No oropharyngeal exudate, posterior oropharyngeal edema or tonsillar abscesses.  Eyes: Conjunctivae are normal.  Cardiovascular: Normal rate and regular rhythm.   Pulmonary/Chest: Effort normal and breath sounds normal.  Musculoskeletal: She exhibits no edema.  Neurological: She is alert and oriented to person, place, and time. No cranial nerve deficit.  Skin: Skin is warm and dry.  Nursing note and vitals reviewed.         Assessment & Plan:   Elevated blood pressure  Acute pharyngitis, unspecified pharyngitis type No Follow-up on  file.

## 2014-08-17 NOTE — Assessment & Plan Note (Signed)
New- consistent with post nasal drip/beginning of viral illness. Continue flonase.  Radid strep neg- reassurance provided.

## 2014-08-18 ENCOUNTER — Telehealth: Payer: Self-pay

## 2014-08-18 DIAGNOSIS — Z1211 Encounter for screening for malignant neoplasm of colon: Secondary | ICD-10-CM

## 2014-08-18 DIAGNOSIS — D649 Anemia, unspecified: Secondary | ICD-10-CM

## 2014-08-18 MED ORDER — HYDROCHLOROTHIAZIDE 12.5 MG PO CAPS: 12.5000 mg | ORAL_CAPSULE | Freq: Every day | ORAL | Status: DC

## 2014-08-18 NOTE — Telephone Encounter (Signed)
Pt left v/m; pt was seen 08/17/14; last night BP was 180/90. Today BP is 170/92. No CP,SOB or dizziness. Slight h/a near forehead but pt is congested as well; pain level now is 2. [facility]

## 2014-08-18 NOTE — Telephone Encounter (Signed)
I am concerned about her elevated blood pressure.  I will send in rx for HCTZ 12.5 mg daily and I would like her to follow up with one of my partners tomorrow. Her labs looked good other than she is quite anemic.  Is she still having periods?  Has she noticed blood in her stool?  I have not seen her for CPX in years but she is due for a colonoscopy which I would strongly advised that she to allow us to refer her for one.

## 2014-08-18 NOTE — Telephone Encounter (Signed)
Referral placed.

## 2014-08-18 NOTE — Telephone Encounter (Signed)
Spoke to pt and advised per Dr Dayton MartesAron. F/u appt scheduled with Dr Para Marchuncan 3/18. Pt is agreeable to referral and was advised to await a call with appt details

## 2014-08-19 ENCOUNTER — Ambulatory Visit (INDEPENDENT_AMBULATORY_CARE_PROVIDER_SITE_OTHER): Payer: Managed Care, Other (non HMO) | Admitting: Family Medicine

## 2014-08-19 ENCOUNTER — Encounter: Payer: Self-pay | Admitting: Family Medicine

## 2014-08-19 VITALS — BP 132/100 | HR 79 | Temp 98.8°F | Wt 167.8 lb

## 2014-08-19 DIAGNOSIS — D509 Iron deficiency anemia, unspecified: Secondary | ICD-10-CM

## 2014-08-19 DIAGNOSIS — I10 Essential (primary) hypertension: Secondary | ICD-10-CM

## 2014-08-19 NOTE — Patient Instructions (Addendum)
When leaving, sign a record release to get the records from Dr. Vincente PoliGrewal sent to Dr. Dayton MartesAron.  Take your BP in the AM after voiding and resting a few minutes.   Look at the american heart association site.  https://horn.com/Heart.org.  Read up on hypertension and salt.  Update Dr. Dayton MartesAron in about 1 week with your BP readings.  Take care.  Glad to see you.

## 2014-08-19 NOTE — Progress Notes (Signed)
Pre visit review using our clinic review tool, if applicable. No additional management support is needed unless otherwise documented below in the visit note.  Seen 2 days ago, URI sx resolved.  BP was up, started on HCTZ, now has had 2 doses.  BP usually 140-160/90-100 at home.   No CP, SOB, BLE edema.  She has h/o sulfa but has tolerated HCTZ so far.  D/w pt.   When her pressure was up, she would have a HA and feel hot.   Less of the hot feeling in the meantime, since BP was treated.  Less HA now, but some of the remaining headache may be related to menses.    Per patient, known h/o iron def anemia.  She asked about referral for colonoscopy.  I referred back to Dr. Elmer SowAron's note.  Per patient, stool cards at gyn clinic have always been neg.  Still with heavy menses but no known GI blood loss.  Labs reviewed with patient.  Microcytic anemia noted.  Patient reports prev iron infusion per GYN.    Meds, vitals, and allergies reviewed.   ROS: See HPI.  Otherwise, noncontributory.  GEN: nad, alert and oriented HEENT: mucous membranes moist NECK: supple w/o LA CV: rrr.  no murmur PULM: ctab, no inc wob ABD: soft, +bs EXT: no edema SKIN: no acute rash

## 2014-08-22 DIAGNOSIS — D509 Iron deficiency anemia, unspecified: Secondary | ICD-10-CM | POA: Insufficient documentation

## 2014-08-22 NOTE — Assessment & Plan Note (Signed)
She has h/o sulfa but has tolerated HCTZ so far.  D/w pt.  Allergy cautions given to patient.  Will defer to PCP.   She can update PCP in a few days with BP readings.

## 2014-08-22 NOTE — Assessment & Plan Note (Signed)
See above.  I asked her to get record release signed with info to go to PCP.  PCP already ordered GI eval.

## 2014-08-23 ENCOUNTER — Telehealth: Payer: Self-pay | Admitting: *Deleted

## 2014-08-23 NOTE — Telephone Encounter (Signed)
Yes it certainly can take a couple of weeks for the medication to work.  How is she feeling?

## 2014-08-23 NOTE — Telephone Encounter (Signed)
Lm on pts vm and advised per Dr Dayton MartesAron. Advised pt to contact office if any change is s/s

## 2014-08-23 NOTE — Telephone Encounter (Signed)
Patient left a voicemail that her BP is not any better since starting her medication. Patient stated that she does not feel that this medication is doing any thing for her elevated BP because the lowest her BP has been is 145/100. Patient stated that she was told that it may take several weeks for the medication to start working, but is concerned. Please advise.

## 2014-09-11 ENCOUNTER — Other Ambulatory Visit: Payer: Self-pay | Admitting: Family Medicine

## 2014-09-12 NOTE — Telephone Encounter (Signed)
Last OV 08/19/14, Last filled 08/18/2014

## 2014-10-10 ENCOUNTER — Other Ambulatory Visit: Payer: Self-pay | Admitting: Family Medicine

## 2015-01-02 ENCOUNTER — Other Ambulatory Visit: Payer: Self-pay | Admitting: Family Medicine

## 2015-01-02 NOTE — Telephone Encounter (Signed)
Pt has only been seen for 1 acute visit since establishing in 2013

## 2015-02-06 ENCOUNTER — Other Ambulatory Visit: Payer: Self-pay | Admitting: Family Medicine

## 2015-02-07 NOTE — Telephone Encounter (Signed)
Pt has only been seen for acute visits in the last 3years

## 2015-03-07 ENCOUNTER — Encounter: Payer: Self-pay | Admitting: Family Medicine

## 2015-03-07 ENCOUNTER — Ambulatory Visit (INDEPENDENT_AMBULATORY_CARE_PROVIDER_SITE_OTHER): Payer: PRIVATE HEALTH INSURANCE | Admitting: Family Medicine

## 2015-03-07 VITALS — BP 156/108 | HR 71 | Temp 98.3°F | Wt 178.5 lb

## 2015-03-07 DIAGNOSIS — I10 Essential (primary) hypertension: Secondary | ICD-10-CM

## 2015-03-07 MED ORDER — HYDROCHLOROTHIAZIDE 25 MG PO TABS: 25.0000 mg | ORAL_TABLET | Freq: Every day | ORAL | Status: DC

## 2015-03-07 NOTE — Progress Notes (Signed)
Pre visit review using our clinic review tool, if applicable. No additional management support is needed unless otherwise documented below in the visit note. 

## 2015-03-07 NOTE — Assessment & Plan Note (Signed)
Deteriorated here and at home. Increase HCTZ to 25 mg daily. Follow up in 2 weeks- recheck BP and BMET at that time. The patient indicates understanding of these issues and agrees with the plan.

## 2015-03-07 NOTE — Patient Instructions (Signed)
Great to see you. We are increasing your HCTZ to 25 mg daily. Please come see me in 2 weeks.

## 2015-03-07 NOTE — Progress Notes (Signed)
.  Subjective:   Patient ID: Dominique Michael, female    DOB: May 20, 1966, 49 y.o.   MRN: [id number]  Dominique Michael is a pleasant 49 y.o. year old female who presents to clinic today with Follow-up  on 03/07/2015  HPI:  Saw Dr. Para March on 3/18 for HTN. Note reviewed. No changes made to HCTZ dose.    Feels BP has continued to increase since that time.  Checks BP at home.  At times face feels flushed.  No HA, CP, SOB, LE edema or blurred vision.  Adopted so unsure if she has family h/o HTN.  She has gained weight recently.  BP Readings from Last 3 Encounters:  03/07/15 156/108  08/19/14 132/100  08/17/14 152/90   Lab Results  Component Value Date   CREATININE 0.63 08/17/2014   Current Outpatient Prescriptions on File Prior to Visit  Medication Sig Dispense Refill  . fluticasone (FLONASE) 50 MCG/ACT nasal spray Place 2 sprays into the nose daily.     No current facility-administered medications on file prior to visit.    Allergies  Allergen Reactions  . Diflucan [Fluconazole]     itching  . Sulfonamide Derivatives     REACTION: Severe reaction.  Avoids foods with Sulfa content.    Past Medical History  Diagnosis Date  . Hypertension   . Asthma     prior to gastric bypass in 2003  . Vitiligo     Past Surgical History  Procedure Laterality Date  . Gastric bypass  2003  . Cesarean section    . Breast surgery      breast lift    Family History  Problem Relation Age of Onset  . Adopted: Yes    Social History   Social History  . Marital Status: Married    Spouse Name: N/A  . Number of Children: N/A  . 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 Smoking status: Former Games developer  . Smokeless tobacco: Never Used     Comment: quit over 20 years ago  . Alcohol Use: No  . Drug Use: No  . Sexual Activity: Yes   Other Topics Concern  . Not on file   Social History Narrative   The PMH,  PSH, Social History, Family History, Medications, and allergies have been reviewed in [facility], and have been updated if relevant.    Review of Systems  Constitutional: Negative.   HENT: Negative.   Respiratory: Negative.   Cardiovascular: Negative.   Gastrointestinal: Negative.   Endocrine: Negative.   Genitourinary: Negative.   Musculoskeletal: Negative.   Skin: Negative.   Allergic/Immunologic: Negative.   Hematological: Negative.   Psychiatric/Behavioral: Negative.   All other systems reviewed and are negative.      Objective:    BP 156/108 mmHg  Pulse 71  Temp(Src) 98.3 F (36.8 C) (Oral)  Wt 178 lb 8 oz (80.967 kg)  SpO2 98%  LMP 02/14/2015 (Approximate) Wt Readings from Last 3 Encounters:  03/07/15 178 lb 8 oz (80.967 kg)  08/19/14 167 lb 12 oz (76.091 kg)  08/17/14 170 lb 12 oz (77.452 kg)     Physical Exam  Constitutional: She is oriented to person, place, and time. She appears well-developed and well-nourished. No distress.  HENT:  Head: Normocephalic.  Eyes: Conjunctivae are normal.  Neck: Normal range of motion. Neck supple.  Cardiovascular: Normal rate and regular rhythm.  Pulmonary/Chest: Effort normal and breath sounds normal. No respiratory distress. She has no wheezes.  Abdominal: Soft.  Musculoskeletal: Normal range of motion. She exhibits no edema.  Neurological: She is alert and oriented to person, place, and time. No cranial nerve deficit.  Skin: Skin is warm and dry.  Psychiatric: She has a normal mood and affect. Her behavior is normal. Judgment and thought content normal.  Nursing note and vitals reviewed.         Assessment & Plan:   Hypertension, essential No Follow-up on file.

## 2015-03-22 ENCOUNTER — Ambulatory Visit (INDEPENDENT_AMBULATORY_CARE_PROVIDER_SITE_OTHER): Payer: PRIVATE HEALTH INSURANCE | Admitting: Family Medicine

## 2015-03-22 ENCOUNTER — Encounter: Payer: Self-pay | Admitting: Family Medicine

## 2015-03-22 VITALS — BP 144/92 | HR 69 | Temp 98.1°F | Wt 176.8 lb

## 2015-03-22 DIAGNOSIS — I10 Essential (primary) hypertension: Secondary | ICD-10-CM

## 2015-03-22 LAB — BASIC METABOLIC PANEL
BUN: 13 mg/dL (ref 6–23)
CALCIUM: 9.5 mg/dL (ref 8.4–10.5)
CHLORIDE: 99 meq/L (ref 96–112)
CO2: 33 meq/L — AB (ref 19–32)
CREATININE: 0.73 mg/dL (ref 0.40–1.20)
GFR: 89.44 mL/min (ref >60.00)
GLUCOSE: 76 mg/dL (ref 70–99)
Potassium: 3.4 mEq/L — ABNORMAL LOW (ref 3.5–5.1)
Sodium: 140 mEq/L (ref 135–145)

## 2015-03-22 NOTE — Assessment & Plan Note (Signed)
Improved here and normotensive at home. Continue HCTZ 25 mg daily. Check BP today. The patient indicates understanding of these issues and agrees with the plan. Orders Placed This Encounter  Procedures  . Basic Metabolic Panel

## 2015-03-22 NOTE — Progress Notes (Signed)
Pre visit review using our clinic review tool, if applicable. No additional management support is needed unless otherwise documented below in the visit note. 

## 2015-03-22 NOTE — Progress Notes (Signed)
.  Subjective:   Patient ID: Dominique Michael, female    DOB: 05/19/1966, 49 y.o.   MRN: [id number]  Dominique Michael is a pleasant 49 y.o. year old female who presents to clinic today with Follow-up  on 03/22/2015  HPI:  Saw Dr. Para Marchuncan on 3/18 for HTN. Note reviewed. No changes made to HCTZ dose.    I saw her on 10/4 for follow up.  At that time, felt BP was continuing to increase at home and was elevated her as well- 156/108.  At times felt like her face was flushed.  No HA, CP, SOB, LE edema or blurred vision.  Adopted so unsure if she has family h/o HTN.  We therefore increased her HCTZ to 25 mg daily and advised follow up today for BP recheck and BMET.  BP has been better controlled at home.  Denies any leg cramping or other side effects.  BP Readings from Last 3 Encounters:  03/22/15 144/92  03/07/15 156/108  08/19/14 132/100   Lab Results  Component Value Date   CREATININE 0.63 08/17/2014   Current Outpatient Prescriptions on File Prior to Visit  Medication Sig Dispense Refill  . fluticasone (FLONASE) 50 MCG/ACT nasal spray Place 2 sprays into the nose daily.    . hydrochlorothiazide (HYDRODIURIL) 25 MG tablet Take 1 tablet (25 mg total) by mouth daily. 30 tablet 3   No current facility-administered medications on file prior to visit.    Allergies  Allergen Reactions  . Diflucan [Fluconazole]     itching  . Sulfonamide Derivatives     REACTION: Severe reaction.  Avoids foods with Sulfa content.    Past Medical History  Diagnosis Date  . Hypertension   . Asthma     prior to gastric bypass in 2003  . Vitiligo     Past Surgical History  Procedure Laterality Date  . Gastric bypass  2003  . Cesarean section    . Breast surgery      breast lift    Family History  Problem Relation Age of Onset  . Adopted: Yes    Social History   Social History  . Marital Status: Married    Spouse Name: N/A  . Number of Children: N/A  . Years of Education: N/A     Occupational History  . student     dental hygeine   Social History Main Topics  . Smoking status: Former Games developermoker  . Smokeless tobacco: Never Used     Comment: quit over 20 years ago  . Alcohol Use: No  . Drug Use: No  . Sexual Activity: Yes   Other Topics Concern  . Not on file   Social History Narrative   The PMH, PSH, Social History, Family History, Medications, and allergies [facility], and have been updated if relevant.    Review of Systems  Constitutional: Negative.   HENT: Negative.   Respiratory: Negative.   Cardiovascular: Negative.   Gastrointestinal: Negative.   Endocrine: Negative.   Genitourinary: Negative.   Musculoskeletal: Negative.   Skin: Negative.   Allergic/Immunologic: Negative.   Hematological: Negative.   Psychiatric/Behavioral: Negative.   All other systems reviewed and are negative.      Objective:    BP 144/92 mmHg  Pulse 69  Temp(Src) 98.1 F (36.7 C) (Oral)  Wt 176 lb 12 oz (80.173 kg)  SpO2 97%  LMP 02/14/2015 (Approximate) Wt Readings from Last 3 Encounters:  03/22/15 176 lb 12 oz (80.173  kg)  03/07/15 178 lb 8 oz (80.967 kg)  08/19/14 167 lb 12 oz (76.091 kg)     Physical Exam  Constitutional: She is oriented to person, place, and time. She appears well-developed and well-nourished. No distress.  HENT:  Head: Normocephalic.  Eyes: Conjunctivae are normal.  Neck: Normal range of motion. Neck supple.  Cardiovascular: Normal rate and regular rhythm.   Pulmonary/Chest: Effort normal and breath sounds normal. No respiratory distress. She has no wheezes.  Abdominal: Soft.  Musculoskeletal: Normal range of motion. She exhibits no edema.  Neurological: She is alert and oriented to person, place, and time. No cranial nerve deficit.  Skin: Skin is warm and dry.  Psychiatric: She has a normal mood and affect. Her behavior is normal. Judgment and thought content normal.  Nursing note and vitals  reviewed.         Assessment & Plan:   Hypertension, essential - Plan: Basic Metabolic Panel No Follow-up on file.

## 2015-03-23 ENCOUNTER — Other Ambulatory Visit: Payer: Self-pay | Admitting: Family Medicine

## 2015-03-23 ENCOUNTER — Telehealth: Payer: Self-pay | Admitting: Family Medicine

## 2015-03-23 MED ORDER — POTASSIUM CHLORIDE CRYS ER 10 MEQ PO TBCR: EXTENDED_RELEASE_TABLET | ORAL | Status: AC

## 2015-03-23 NOTE — Telephone Encounter (Signed)
She does not need a higher dose of potassium.  Her potassium was very close to normal and it dangerous to give too much potassium.  If her pain is that severe, it is not from low potassium.  Perhaps she pinched a nerve? And may need to be evaluated here tomorrow.

## 2015-03-23 NOTE — Telephone Encounter (Signed)
Spoke to pt and advised per Dr Dayton MartesAron. Pt is wanting to wait until after repeats labs on Monday to determine if she needs to be seen

## 2015-03-23 NOTE — Telephone Encounter (Signed)
Lm on pts vm requesting a call back 

## 2015-03-23 NOTE — Telephone Encounter (Signed)
Pt is calling in ref to the potassium pills you called in to pharmacy for her.  She states pharmacy told her that they were such a low dose that it would not make a difference for her.  She states her leg is camping so bad and hurting so bad due to low potassium that she is limping.  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  Pt requests c/b. Thank you.

## 2015-03-27 ENCOUNTER — Other Ambulatory Visit: Payer: PRIVATE HEALTH INSURANCE

## 2015-07-03 ENCOUNTER — Other Ambulatory Visit: Payer: Self-pay | Admitting: *Deleted

## 2015-07-03 MED ORDER — HYDROCHLOROTHIAZIDE 25 MG PO TABS: 25.0000 mg | ORAL_TABLET | Freq: Every day | ORAL | Status: DC

## 2016-03-11 ENCOUNTER — Ambulatory Visit: Payer: PRIVATE HEALTH INSURANCE | Admitting: Internal Medicine

## 2016-03-25 ENCOUNTER — Other Ambulatory Visit: Payer: Self-pay | Admitting: Family Medicine

## 2016-04-29 ENCOUNTER — Other Ambulatory Visit: Payer: Self-pay | Admitting: Family Medicine

## 2016-05-28 ENCOUNTER — Other Ambulatory Visit: Payer: Self-pay | Admitting: Family Medicine

## 2016-07-04 ENCOUNTER — Other Ambulatory Visit: Payer: Self-pay | Admitting: Family Medicine

## 2016-07-04 ENCOUNTER — Other Ambulatory Visit: Payer: Self-pay

## 2016-07-04 MED ORDER — HYDROCHLOROTHIAZIDE 25 MG PO TABS: 25.0000 mg | ORAL_TABLET | Freq: Every day | ORAL | 0 refills | Status: AC

## 2016-07-04 NOTE — Telephone Encounter (Signed)
Pt left v/m requesting refill HCTZ; per DPR left v/m requesting pt to cb for CPX so med could be refilled.

## 2016-07-04 NOTE — Telephone Encounter (Signed)
Pt scheduled CPX on 08/12/16 advised pt I could send in # [phone] to [facility]. Pt said she was recently seen; I advised per pt chart last time seen was 03/22/15. Pt will keep appt on 08/12/16 and pt will ck with  Pharmacy.

## 2016-08-12 ENCOUNTER — Encounter: Payer: PRIVATE HEALTH INSURANCE | Admitting: Family Medicine

## 2019-08-21 ENCOUNTER — Ambulatory Visit: Payer: PRIVATE HEALTH INSURANCE | Attending: Internal Medicine

## 2019-08-21 DIAGNOSIS — Z23 Encounter for immunization: Secondary | ICD-10-CM

## 2019-08-21 NOTE — Progress Notes (Signed)
   Covid-19 Vaccination Clinic  Name:  Dominique Michael    MRN: [id number] DOB: Sep 05, 1965  08/21/2019  [name] was observed post Covid-19 immunization for 15 minutes without incident. She was provided with Vaccine Information Sheet and instruction to access the V-Safe system.   [name] was instructed to call 911 with any severe reactions post vaccine: Marland Kitchen Difficulty breathing  . Swelling of face and throat  . A fast heartbeat  . A bad rash all over body  . Dizziness and weakness   Immunizations Administered    Name Date Dose VIS Date Route   Pfizer COVID-19 Vaccine 08/21/2019  5:57 PM 0.3 mL 05/14/2019 Intramuscular   Manufacturer: [employer], [employer]   Lot: [id number]   NDC: [address]-1

## 2019-09-11 ENCOUNTER — Ambulatory Visit: Payer: PRIVATE HEALTH INSURANCE | Attending: Internal Medicine

## 2019-09-11 DIAGNOSIS — Z23 Encounter for immunization: Secondary | ICD-10-CM

## 2019-09-11 NOTE — Progress Notes (Signed)
   Covid-19 Vaccination Clinic  Name:  Dominique Michael    MRN: [id number] DOB: 10/29/1965  09/11/2019  [name] was observed post Covid-19 immunization for 30 minutes based on pre-vaccination screening without incident. She was provided with Vaccine Information Sheet and instruction to access the V-Safe system.   [name] was instructed to call 911 with any severe reactions post vaccine: Marland Kitchen Difficulty breathing  . Swelling of face and throat  . A fast heartbeat  . A bad rash all over body  . Dizziness and weakness   Immunizations Administered    Name Date Dose VIS Date Route   Pfizer COVID-19 Vaccine 09/11/2019  6:06 PM 0.3 mL 05/14/2019 Intramuscular   Manufacturer: [employer], [employer]   Lot: [id number]   NDC: [address]-8

## 2019-11-15 ENCOUNTER — Other Ambulatory Visit: Payer: Self-pay | Admitting: Obstetrics and Gynecology

## 2019-11-15 DIAGNOSIS — R928 Other abnormal and inconclusive findings on diagnostic imaging of breast: Secondary | ICD-10-CM

## 2019-11-24 ENCOUNTER — Ambulatory Visit: Payer: PRIVATE HEALTH INSURANCE

## 2019-11-24 ENCOUNTER — Ambulatory Visit
Admission: RE | Admit: 2019-11-24 | Discharge: 2019-11-24 | Disposition: A | Discharge status: Home / Self Care | Payer: BC Managed Care – PPO | Source: Ambulatory Visit | Attending: Obstetrics and Gynecology | Admitting: Obstetrics and Gynecology

## 2019-11-24 ENCOUNTER — Other Ambulatory Visit: Payer: Self-pay | Admitting: Obstetrics and Gynecology

## 2019-11-24 ENCOUNTER — Other Ambulatory Visit: Payer: Self-pay

## 2019-11-24 DIAGNOSIS — R928 Other abnormal and inconclusive findings on diagnostic imaging of breast: Secondary | ICD-10-CM

## 2020-12-05 ENCOUNTER — Other Ambulatory Visit: Payer: Self-pay | Admitting: Obstetrics and Gynecology

## 2020-12-05 DIAGNOSIS — R928 Other abnormal and inconclusive findings on diagnostic imaging of breast: Secondary | ICD-10-CM

## 2020-12-11 ENCOUNTER — Other Ambulatory Visit: Payer: Self-pay

## 2020-12-11 ENCOUNTER — Ambulatory Visit
Admission: RE | Admit: 2020-12-11 | Discharge: 2020-12-11 | Disposition: A | Discharge status: Home / Self Care | Payer: 59 | Source: Ambulatory Visit | Attending: Obstetrics and Gynecology | Admitting: Obstetrics and Gynecology

## 2020-12-11 DIAGNOSIS — R928 Other abnormal and inconclusive findings on diagnostic imaging of breast: Secondary | ICD-10-CM

## 2020-12-26 ENCOUNTER — Other Ambulatory Visit: Payer: PRIVATE HEALTH INSURANCE

## 2024-04-15 ENCOUNTER — Encounter (HOSPITAL_BASED_OUTPATIENT_CLINIC_OR_DEPARTMENT_OTHER): Payer: Self-pay
